# Patient Record
Sex: Female | Born: 1985 | Race: White | Hispanic: No | Marital: Married | Smoking: Never smoker
Health system: Southern US, Community
[De-identification: ages and names within clinical notes are randomized; demographics above are authoritative.]

## PROBLEM LIST (undated history)

## (undated) DIAGNOSIS — O26899 Other specified pregnancy related conditions, unspecified trimester: Secondary | ICD-10-CM

## (undated) DIAGNOSIS — R112 Nausea with vomiting, unspecified: Secondary | ICD-10-CM

## (undated) DIAGNOSIS — Z8619 Personal history of other infectious and parasitic diseases: Secondary | ICD-10-CM

## (undated) DIAGNOSIS — Z9889 Other specified postprocedural states: Secondary | ICD-10-CM

## (undated) DIAGNOSIS — T8859XA Other complications of anesthesia, initial encounter: Secondary | ICD-10-CM

## (undated) DIAGNOSIS — R12 Heartburn: Secondary | ICD-10-CM

## (undated) DIAGNOSIS — T4145XA Adverse effect of unspecified anesthetic, initial encounter: Secondary | ICD-10-CM

## (undated) DIAGNOSIS — R51 Headache: Secondary | ICD-10-CM

## (undated) HISTORY — PX: NO PAST SURGERIES: SHX2092

## (undated) HISTORY — DX: Personal history of other infectious and parasitic diseases: Z86.19

---
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
## 2005-03-24 ENCOUNTER — Emergency Department (HOSPITAL_COMMUNITY): Admission: EM | Admit: 2005-03-24 | Discharge: 2005-03-24 | Payer: Self-pay | Admitting: Emergency Medicine

## 2005-05-07 ENCOUNTER — Ambulatory Visit: Payer: Self-pay | Admitting: Psychology

## 2012-02-14 LAB — OB RESULTS CONSOLE GC/CHLAMYDIA
Chlamydia: NEGATIVE
Gonorrhea: NEGATIVE

## 2012-02-14 LAB — OB RESULTS CONSOLE ANTIBODY SCREEN: Antibody Screen: NEGATIVE

## 2012-02-14 LAB — OB RESULTS CONSOLE RUBELLA ANTIBODY, IGM: Rubella: IMMUNE

## 2012-09-11 ENCOUNTER — Other Ambulatory Visit (HOSPITAL_COMMUNITY): Payer: Self-pay | Admitting: Obstetrics and Gynecology

## 2012-09-11 DIAGNOSIS — O409XX0 Polyhydramnios, unspecified trimester, not applicable or unspecified: Secondary | ICD-10-CM

## 2012-09-11 DIAGNOSIS — O359XX0 Maternal care for (suspected) fetal abnormality and damage, unspecified, not applicable or unspecified: Secondary | ICD-10-CM

## 2012-09-15 ENCOUNTER — Telehealth (HOSPITAL_COMMUNITY): Payer: Self-pay | Admitting: *Deleted

## 2012-09-15 ENCOUNTER — Encounter (HOSPITAL_COMMUNITY): Payer: Self-pay | Admitting: *Deleted

## 2012-09-15 NOTE — Telephone Encounter (Signed)
Preadmission screen  

## 2012-09-16 ENCOUNTER — Ambulatory Visit (HOSPITAL_COMMUNITY)
Admission: RE | Admit: 2012-09-16 | Discharge: 2012-09-16 | Disposition: A | Discharge status: Home / Self Care | Payer: Managed Care, Other (non HMO) | Source: Ambulatory Visit | Attending: Obstetrics and Gynecology | Admitting: Obstetrics and Gynecology

## 2012-09-16 ENCOUNTER — Other Ambulatory Visit (HOSPITAL_COMMUNITY): Payer: Self-pay | Admitting: Obstetrics and Gynecology

## 2012-09-16 DIAGNOSIS — O409XX0 Polyhydramnios, unspecified trimester, not applicable or unspecified: Secondary | ICD-10-CM

## 2012-09-16 DIAGNOSIS — O4190X0 Disorder of amniotic fluid and membranes, unspecified, unspecified trimester, not applicable or unspecified: Secondary | ICD-10-CM

## 2012-09-16 DIAGNOSIS — O359XX0 Maternal care for (suspected) fetal abnormality and damage, unspecified, not applicable or unspecified: Secondary | ICD-10-CM

## 2012-09-16 DIAGNOSIS — Z3689 Encounter for other specified antenatal screening: Secondary | ICD-10-CM | POA: Insufficient documentation

## 2012-09-16 IMAGING — US US FETAL BPP W/O NONSTRESS
1 series · 13 of 28 positions shown · non-contrast
Comparison: none

[Series 1: us fetal bpp w/o nonstress · non-contrast · 39 acquisitions, 13 frames shown]
[im 2/39]
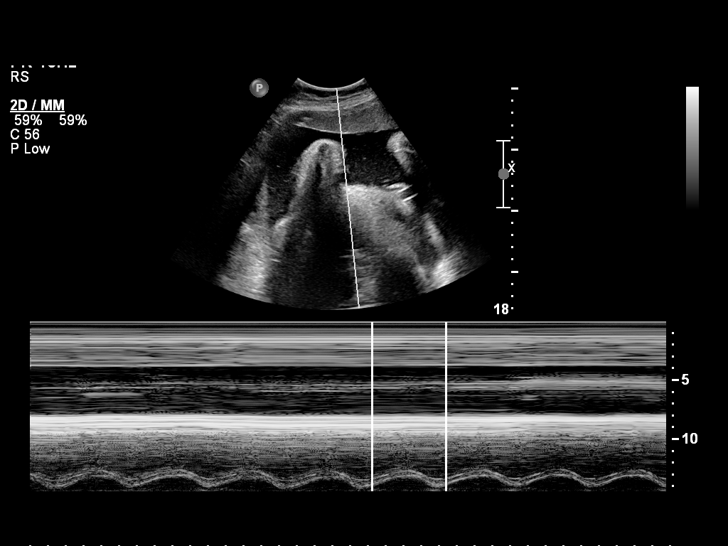
[im 5/39]
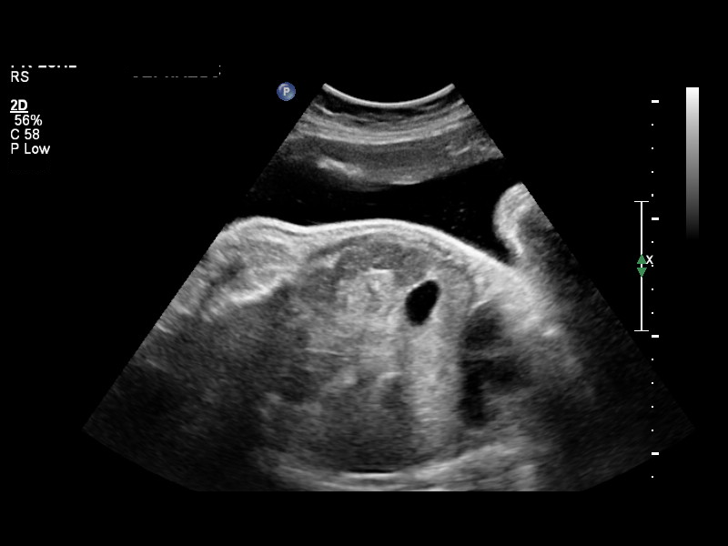
[im 8/39]
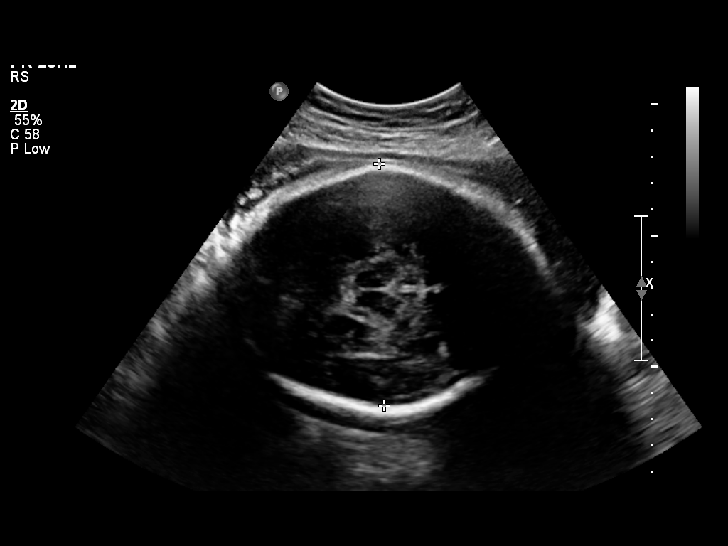
[im 10/39]
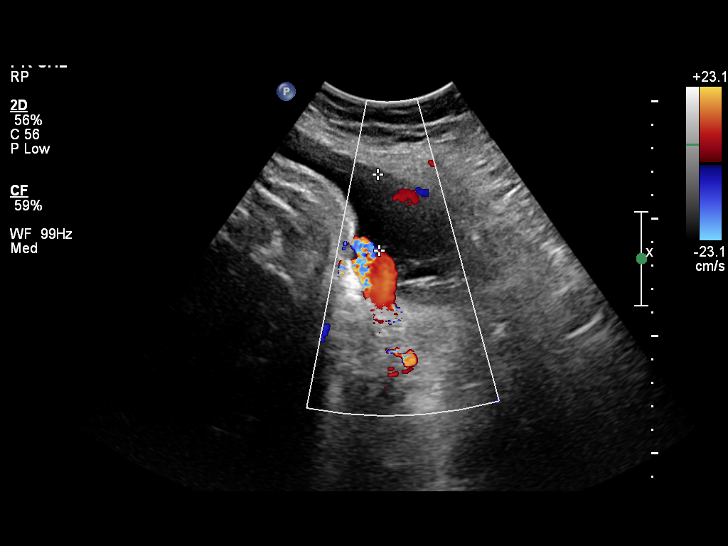
[im 13/39]
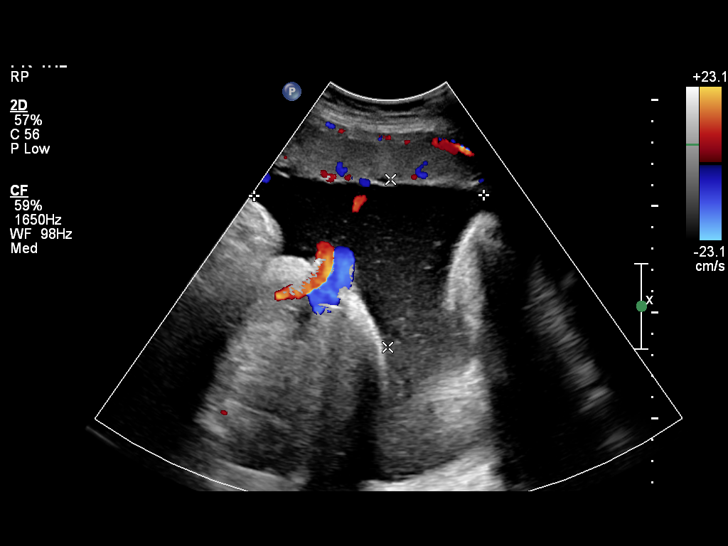
[im 16/39]
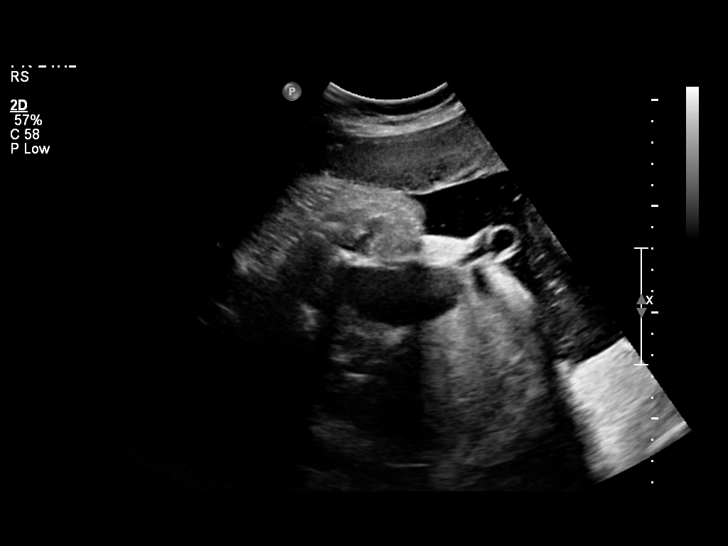
[im 20/39]
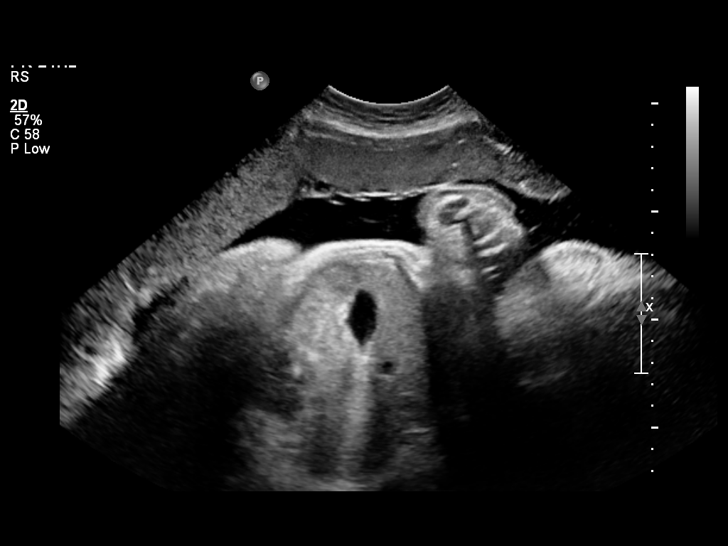
[im 23/39]
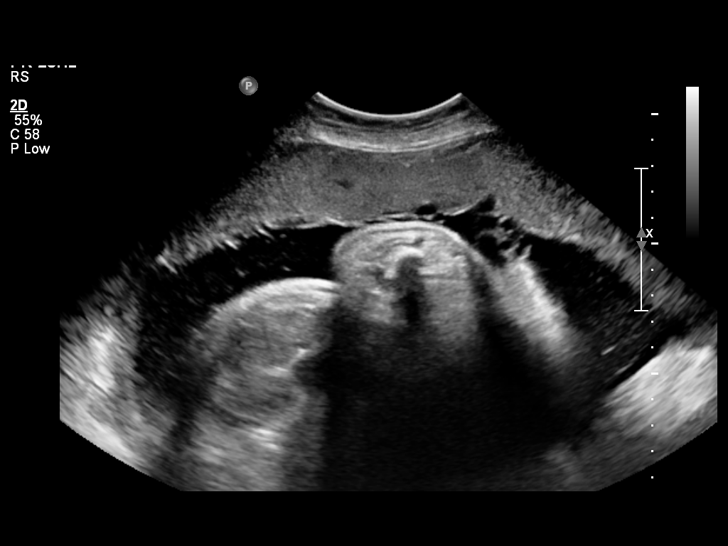
[im 26/39]
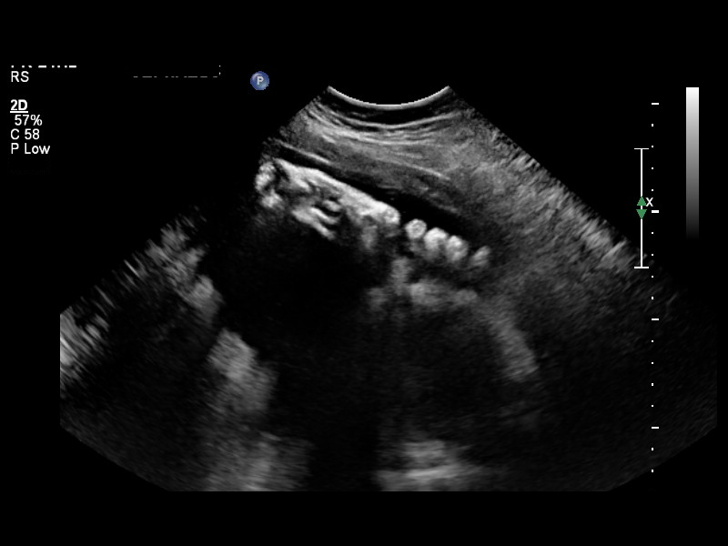
[im 29/39]
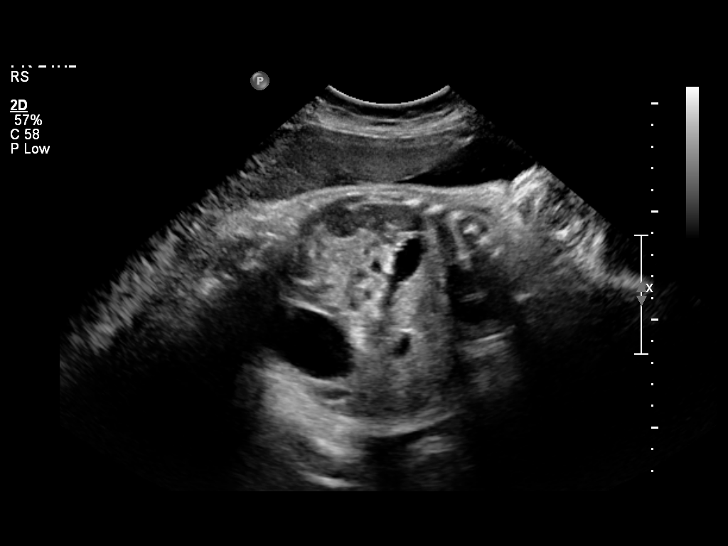
[im 31/39]
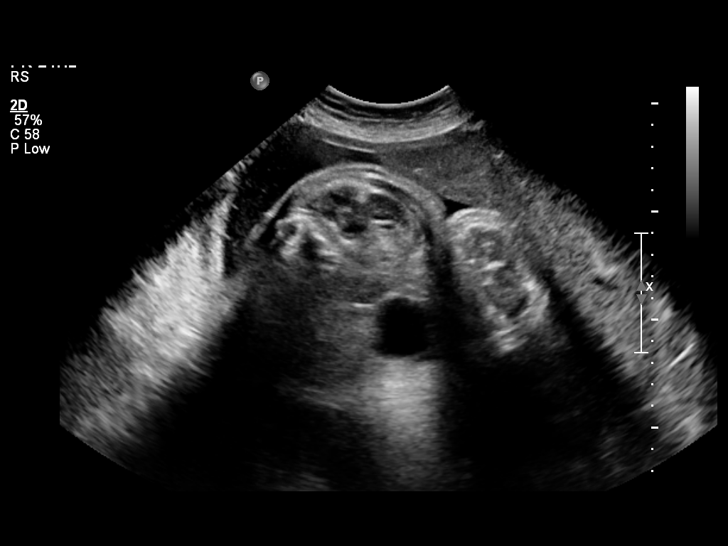
[im 34/39]
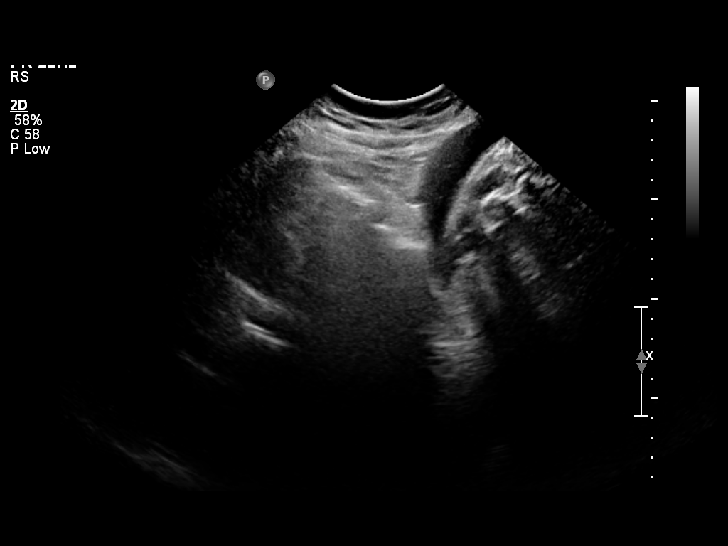
[im 37/39]
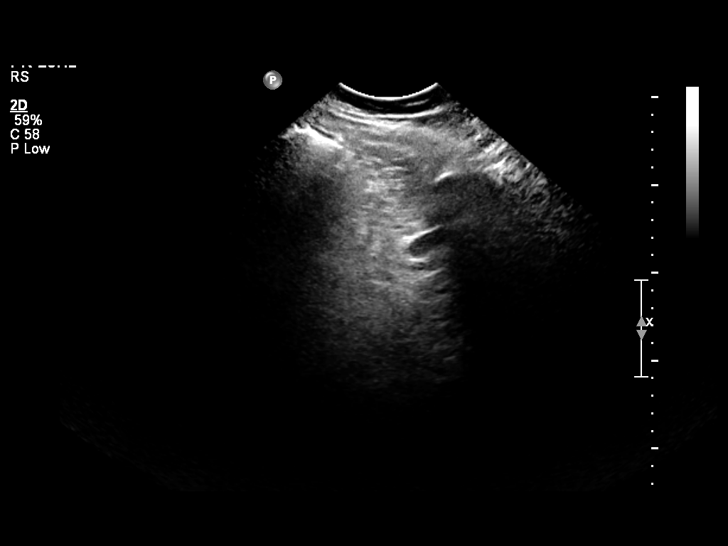

[13 of 28 positions shown; findings below may reference images not displayed]

OBSTETRICS REPORT
                      (Signed Final [DATE] [DATE])

Service(s) Provided

 [HOSPITAL]                                         [address]15.0
Indications

 Polyhydramnios
 Enlarged fetal bladder

Fetal Evaluation

 Num Of Fetuses:    1
 Fetal Heart Rate:  144                         bpm
 Cardiac Activity:  Observed
 Presentation:      Cephalic
 Placenta:          Anterior, above cervical os

 Amniotic Fluid
 AFI FV:      Subjectively increased
 AFI Sum:     23.63   cm      95   %Tile     Larg Pckt:     9.6  cm
 RUQ:   7.63   cm    RLQ:    3.23   cm    LUQ:   9.6     cm   LLQ:    3.17   cm
Biophysical Evaluation

 Amniotic F.V:   Pocket => 2 cm two         F. Tone:        Observed
                 planes
 F. Movement:    Observed                   Score:          [DATE]
 F. Breathing:   Observed
Biometry

 BPD:     91.7  mm    G. Age:   37w 2d
Gestational Age

 LMP:           38w 0d       Date:   [DATE]                 EDD:   [DATE]
 Clinical EDD:  38w 5d                                        EDD:   [DATE]
 U/S Today:     37w 2d                                        EDD:   [DATE]
 Best:          38w 5d    Det. By:   Clinical EDD             EDD:   [DATE]
Cervix Uterus Adnexa

 Cervix:       Not visualized (advanced GA >34 wks)
 Left Ovary:   Not visualized.
 Right Ovary:  Not visualized.
 Adnexa:     No abnormality visualized.
Impression

 Single living intrauterine fetus in Cephalic presentation.
 Amniotic fluid volume is subjectively increased, with AFI of
 23.63 cm (95 %ile).
 Biophysical profile score of [DATE].

 questions or concerns.

## 2012-09-24 ENCOUNTER — Ambulatory Visit (HOSPITAL_COMMUNITY)
Admission: RE | Admit: 2012-09-24 | Discharge: 2012-09-24 | Disposition: A | Discharge status: Home / Self Care | Payer: Managed Care, Other (non HMO) | Source: Ambulatory Visit | Attending: Obstetrics and Gynecology | Admitting: Obstetrics and Gynecology

## 2012-09-24 DIAGNOSIS — O409XX0 Polyhydramnios, unspecified trimester, not applicable or unspecified: Secondary | ICD-10-CM

## 2012-09-24 DIAGNOSIS — O4190X0 Disorder of amniotic fluid and membranes, unspecified, unspecified trimester, not applicable or unspecified: Secondary | ICD-10-CM

## 2012-09-24 DIAGNOSIS — Z3689 Encounter for other specified antenatal screening: Secondary | ICD-10-CM | POA: Insufficient documentation

## 2012-09-24 IMAGING — US US FETAL BPP W/O NONSTRESS
2 series · 13 of 21 positions shown · non-contrast
Comparison: none

[Series 1: us fetal bpp w/o nonstress · non-contrast · 1 of 1 slices shown (1 of 2)]
[im 1/1]
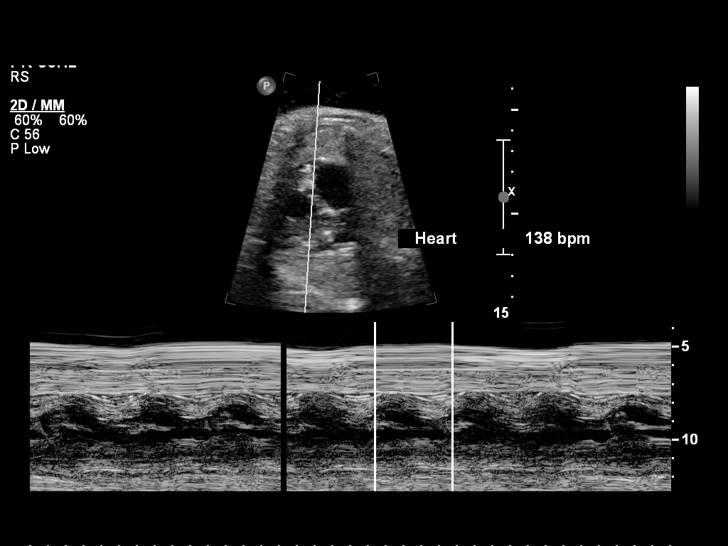

[Series 1: us fetal bpp w/o nonstress · non-contrast · 20 acquisitions, 12 frames shown (2 of 2)]
[im 2/20]
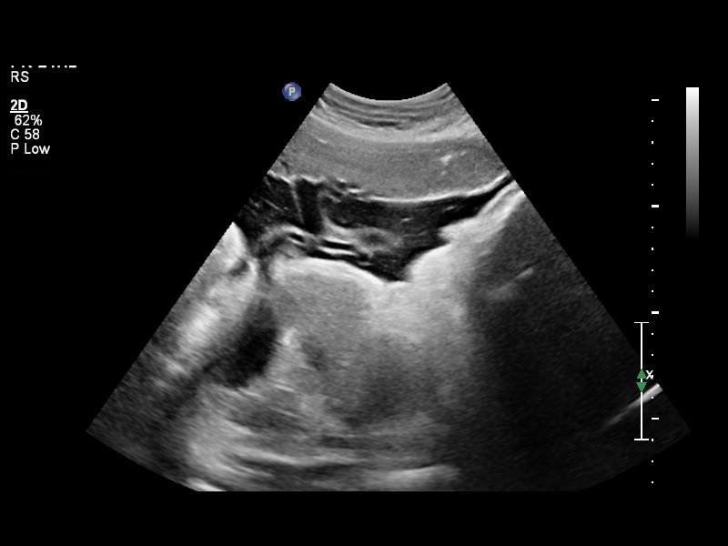
[im 4/20]
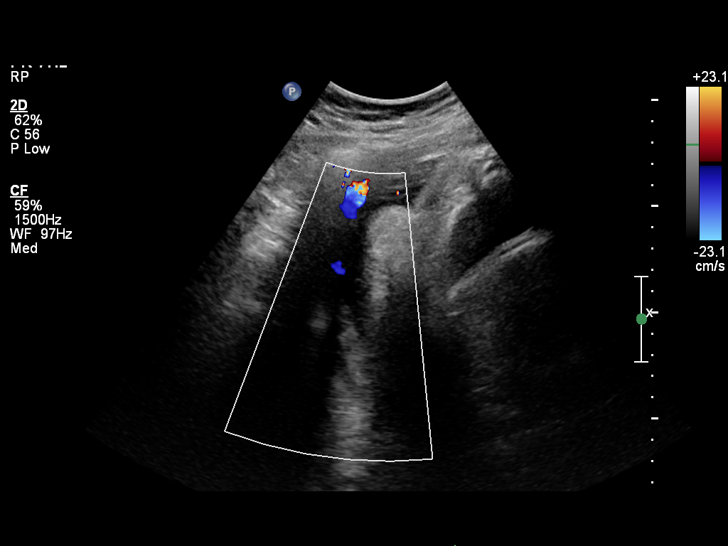
[im 5/20]
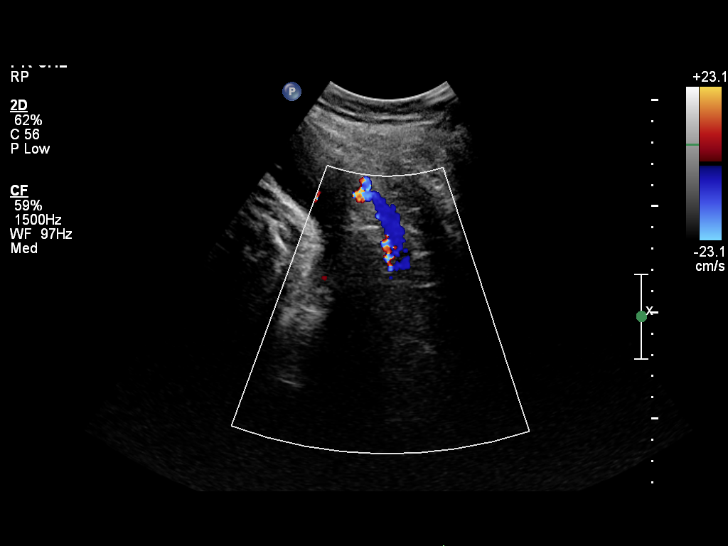
[im 7/20]
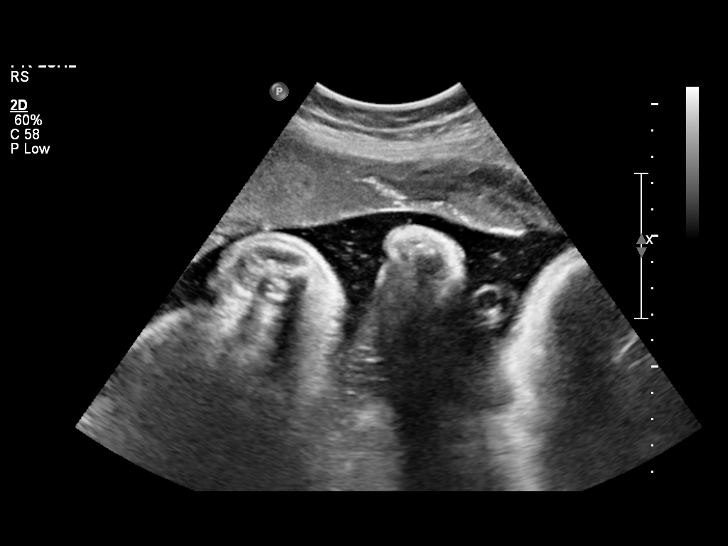
[im 8/20]
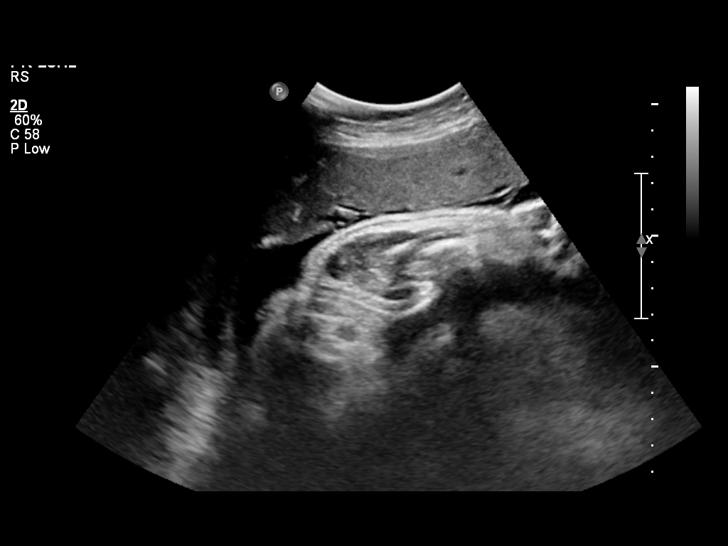
[im 10/20]
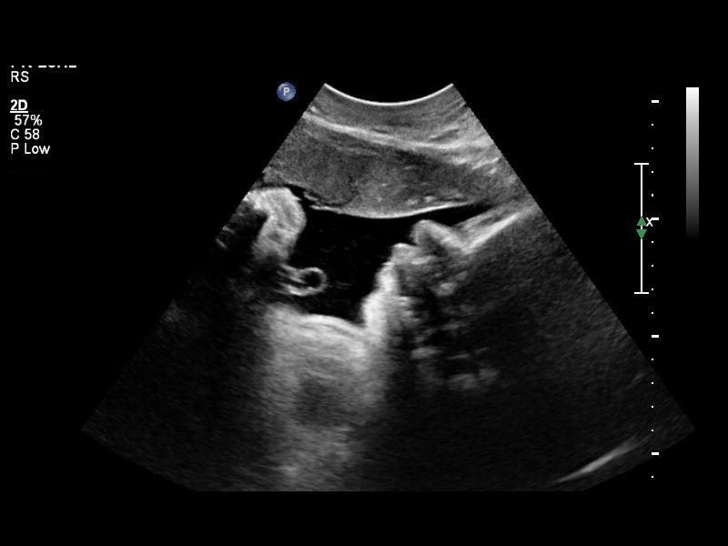
[im 12/20]
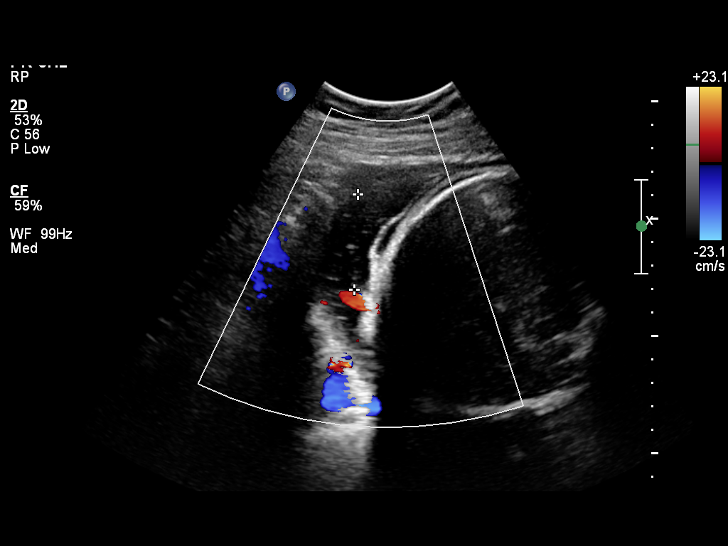
[im 13/20]
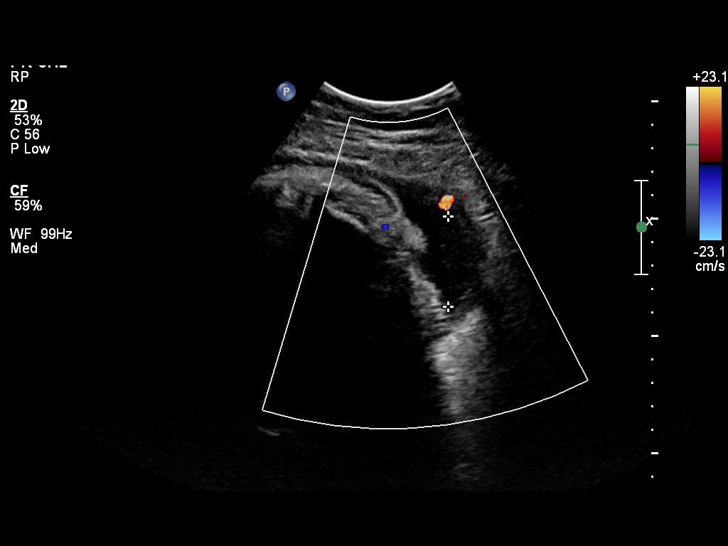
[im 15/20]
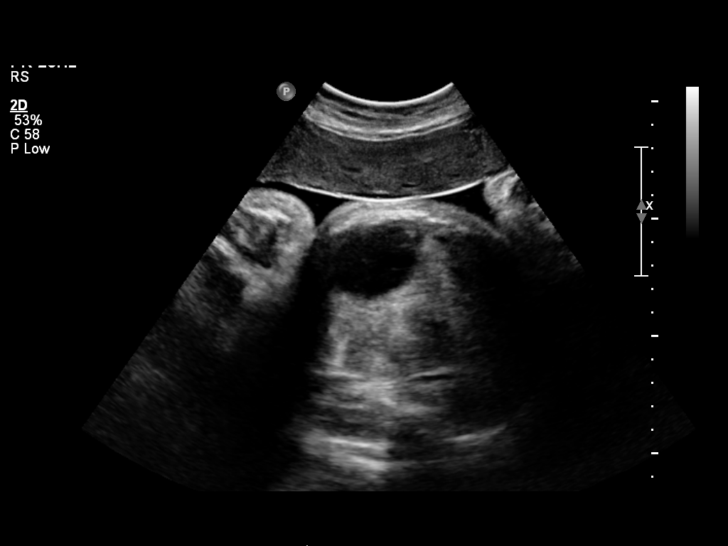
[im 16/20]
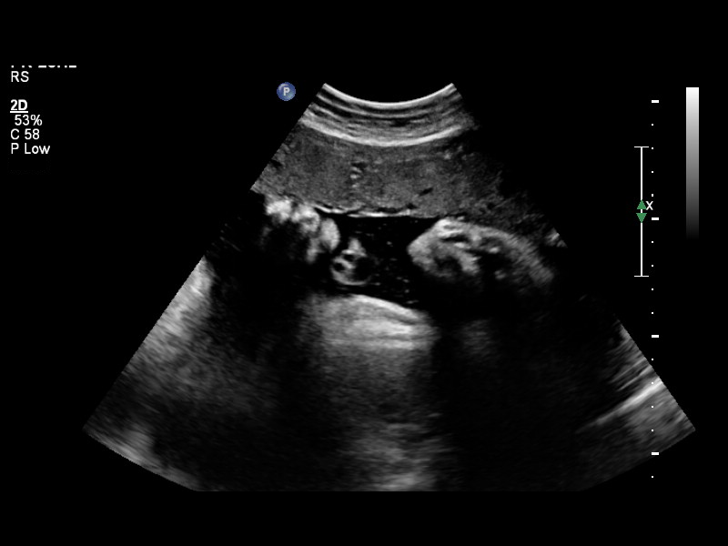
[im 18/20]
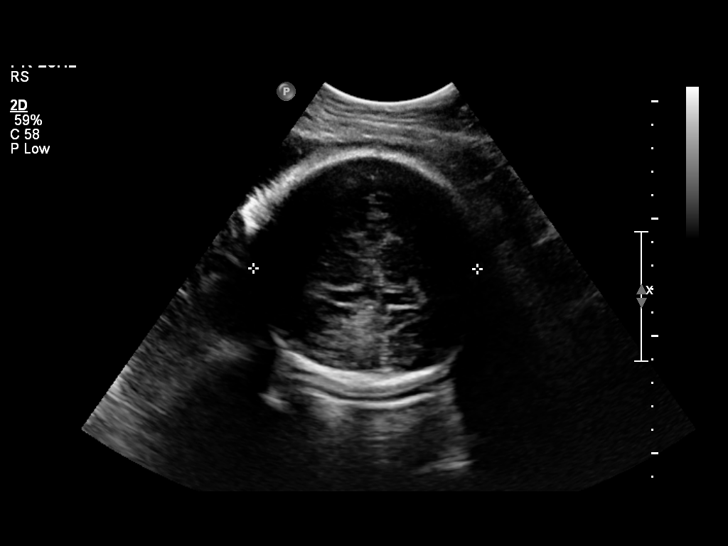
[im 20/20]
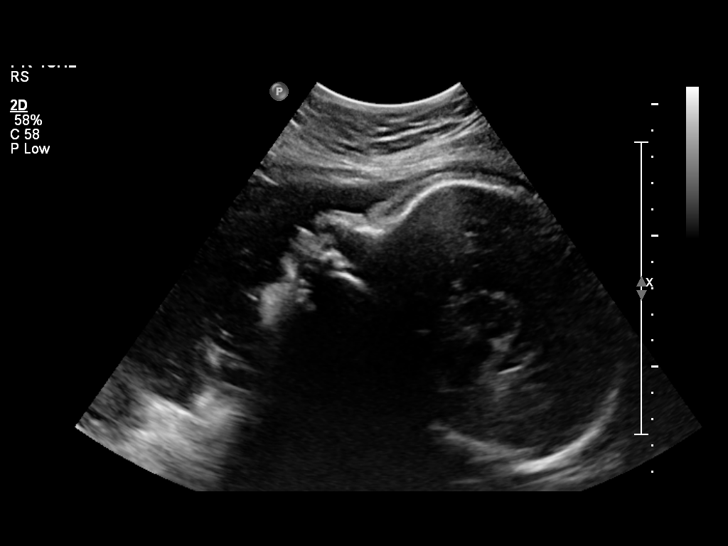

[13 of 21 positions shown; findings below may reference images not displayed]

OBSTETRICS REPORT
                      (Signed Final 09/24/2012 [DATE])

Service(s) Provided

 [HOSPITAL]                                         [address].0
Indications

 Polyhydramnios
Fetal Evaluation

 Num Of Fetuses:    1
 Fetal Heart Rate:  138                         bpm
 Cardiac Activity:  Observed
 Presentation:      Cephalic
 Placenta:          Anterior, above cervical os
 P. Cord            Previously Visualized
 Insertion:

 Comment:    BPP [DATE] in 20 minutes.

 Amniotic Fluid
 AFI FV:      Subjectively upper-normal
 AFI Sum:     20.95   cm      92   %Tile     Larg Pckt:   6.62   cm
 RUQ:   6.62   cm    RLQ:    4.05   cm    LUQ:   6.44    cm   LLQ:    3.84   cm
Biophysical Evaluation

 Amniotic F.V:   Pocket => 2 cm two         F. Tone:        Observed
                 planes
 F. Movement:    Observed                   Score:          [DATE]
 F. Breathing:   Observed
Biometry

 BPD:     95.2  mm    G. Age:   38w 6d
Gestational Age

 LMP:           39w 1d       Date:   12/25/11                 EDD:   09/30/12
 Clinical EDD:  39w 6d                                        EDD:   09/25/12
 U/S Today:     38w 6d                                        EDD:   10/02/12
 Best:          39w 6d    Det. By:   Clinical EDD             EDD:   09/25/12
Cervix Uterus Adnexa
 Cervix:       Not visualized (advanced GA >34 wks)
Impression

 Single live IUP in cephalic presentation.  BPP [DATE].
 Stable subjectively and quantitatively increased amniotic fluid
 volume.

 questions or concerns.

## 2012-09-25 ENCOUNTER — Inpatient Hospital Stay (HOSPITAL_COMMUNITY): Admission: AD | Admit: 2012-09-25 | Payer: Self-pay | Source: Ambulatory Visit | Admitting: Obstetrics and Gynecology

## 2012-09-25 ENCOUNTER — Inpatient Hospital Stay (HOSPITAL_COMMUNITY)
Admission: RE | Admit: 2012-09-25 | Discharge: 2012-09-28 | DRG: 766 | Disposition: A | Discharge status: Home / Self Care | Payer: Managed Care, Other (non HMO) | Source: Ambulatory Visit | Attending: Obstetrics and Gynecology | Admitting: Obstetrics and Gynecology

## 2012-09-25 ENCOUNTER — Encounter (HOSPITAL_COMMUNITY): Payer: Self-pay

## 2012-09-25 VITALS — BP 99/63 | HR 102 | Temp 98.3°F | Resp 20 | Ht 60.0 in | Wt 212.0 lb

## 2012-09-25 DIAGNOSIS — O34219 Maternal care for unspecified type scar from previous cesarean delivery: Secondary | ICD-10-CM

## 2012-09-25 DIAGNOSIS — O339 Maternal care for disproportion, unspecified: Secondary | ICD-10-CM | POA: Diagnosis present

## 2012-09-25 DIAGNOSIS — O330 Maternal care for disproportion due to deformity of maternal pelvic bones: Secondary | ICD-10-CM | POA: Diagnosis present

## 2012-09-25 DIAGNOSIS — O3660X0 Maternal care for excessive fetal growth, unspecified trimester, not applicable or unspecified: Secondary | ICD-10-CM | POA: Diagnosis present

## 2012-09-25 DIAGNOSIS — O409XX0 Polyhydramnios, unspecified trimester, not applicable or unspecified: Principal | ICD-10-CM | POA: Diagnosis present

## 2012-09-25 LAB — CBC
Hemoglobin: 11.5 g/dL — ABNORMAL LOW (ref 12.0–15.0)
MCH: 29.6 pg (ref 26.0–34.0)
MCHC: 33 g/dL (ref 30.0–36.0)
RDW: 14.1 % (ref 11.5–15.5)

## 2012-09-25 LAB — RPR: RPR Ser Ql: NONREACTIVE

## 2012-09-25 LAB — PREPARE RBC (CROSSMATCH)

## 2012-09-25 MED ORDER — EPHEDRINE 5 MG/ML INJ
10.0000 mg | INTRAVENOUS | Status: DC | PRN
  Filled 2012-09-25 (×2): qty 4

## 2012-09-25 MED ORDER — LIDOCAINE HCL (PF) 1 % IJ SOLN: 30.0000 mL | INTRAMUSCULAR | Status: DC | PRN

## 2012-09-25 MED ORDER — OXYTOCIN 40 UNITS IN LACTATED RINGERS INFUSION - SIMPLE MED
1.0000 m[IU]/min | INTRAVENOUS | Status: DC
  Administered 2012-09-25: 2 m[IU]/min via INTRAVENOUS

## 2012-09-25 MED ORDER — LACTATED RINGERS IV SOLN
500.0000 mL | INTRAVENOUS | Status: DC | PRN
  Administered 2012-09-26: 500 mL via INTRAVENOUS

## 2012-09-25 MED ORDER — TERBUTALINE SULFATE 1 MG/ML IJ SOLN: 0.2500 mg | Freq: Once | INTRAMUSCULAR | Status: AC | PRN

## 2012-09-25 MED ORDER — FLEET ENEMA 7-19 GM/118ML RE ENEM: 1.0000 | ENEMA | RECTAL | Status: DC | PRN

## 2012-09-25 MED ORDER — SODIUM BICARBONATE 8.4 % IV SOLN
INTRAVENOUS | Status: DC | PRN
  Administered 2012-09-25: 5 mL via EPIDURAL

## 2012-09-25 MED ORDER — OXYTOCIN BOLUS FROM INFUSION: 500.0000 mL | INTRAVENOUS | Status: DC

## 2012-09-25 MED ORDER — IBUPROFEN 600 MG PO TABS: 600.0000 mg | ORAL_TABLET | Freq: Four times a day (QID) | ORAL | Status: DC | PRN

## 2012-09-25 MED ORDER — LACTATED RINGERS IV SOLN: 500.0000 mL | Freq: Once | INTRAVENOUS | Status: DC

## 2012-09-25 MED ORDER — PHENYLEPHRINE 40 MCG/ML (10ML) SYRINGE FOR IV PUSH (FOR BLOOD PRESSURE SUPPORT): 80.0000 ug | PREFILLED_SYRINGE | INTRAVENOUS | Status: DC | PRN

## 2012-09-25 MED ORDER — NALBUPHINE SYRINGE 5 MG/0.5 ML: 5.0000 mg | INJECTION | INTRAMUSCULAR | Status: DC | PRN

## 2012-09-25 MED ORDER — EPHEDRINE 5 MG/ML INJ: 10.0000 mg | INTRAVENOUS | Status: DC | PRN

## 2012-09-25 MED ORDER — OXYTOCIN 40 UNITS IN LACTATED RINGERS INFUSION - SIMPLE MED
62.5000 mL/h | INTRAVENOUS | Status: DC
  Filled 2012-09-25: qty 1000

## 2012-09-25 MED ORDER — ONDANSETRON HCL 4 MG/2ML IJ SOLN
4.0000 mg | Freq: Four times a day (QID) | INTRAMUSCULAR | Status: DC | PRN
  Administered 2012-09-25 – 2012-09-26 (×3): 4 mg via INTRAVENOUS
  Filled 2012-09-25 (×3): qty 2

## 2012-09-25 MED ORDER — LACTATED RINGERS IV SOLN
INTRAVENOUS | Status: DC
  Administered 2012-09-25 – 2012-09-26 (×4): via INTRAVENOUS

## 2012-09-25 MED ORDER — PHENYLEPHRINE 40 MCG/ML (10ML) SYRINGE FOR IV PUSH (FOR BLOOD PRESSURE SUPPORT)
80.0000 ug | PREFILLED_SYRINGE | INTRAVENOUS | Status: DC | PRN
  Filled 2012-09-25: qty 5
  Filled 2012-09-25: qty 10

## 2012-09-25 MED ORDER — FENTANYL 2.5 MCG/ML BUPIVACAINE 1/10 % EPIDURAL INFUSION (WH - ANES)
14.0000 mL/h | INTRAMUSCULAR | Status: DC
  Administered 2012-09-25 – 2012-09-26 (×3): 14 mL/h via EPIDURAL
  Filled 2012-09-25 (×3): qty 125

## 2012-09-25 MED ORDER — CITRIC ACID-SODIUM CITRATE 334-500 MG/5ML PO SOLN
30.0000 mL | ORAL | Status: DC | PRN
  Administered 2012-09-26: 30 mL via ORAL
  Filled 2012-09-25: qty 15

## 2012-09-25 MED ORDER — DIPHENHYDRAMINE HCL 50 MG/ML IJ SOLN: 12.5000 mg | INTRAMUSCULAR | Status: DC | PRN

## 2012-09-25 MED ORDER — ZOLPIDEM TARTRATE 5 MG PO TABS: 5.0000 mg | ORAL_TABLET | Freq: Every evening | ORAL | Status: DC | PRN

## 2012-09-25 MED ORDER — ACETAMINOPHEN 325 MG PO TABS: 650.0000 mg | ORAL_TABLET | ORAL | Status: DC | PRN

## 2012-09-25 MED ORDER — OXYCODONE-ACETAMINOPHEN 5-325 MG PO TABS: 1.0000 | ORAL_TABLET | ORAL | Status: DC | PRN

## 2012-09-25 NOTE — Anesthesia Procedure Notes (Signed)

## 2012-09-25 NOTE — Anesthesia Preprocedure Evaluation (Signed)

## 2012-09-25 NOTE — Progress Notes (Signed)
Pt comfortable.  FHTs 130s gstv, NST R Toco q3  SVE per nurse loose 2/80/-2

## 2012-09-25 NOTE — H&P (Signed)
26 y.o. [redacted]w[redacted]d  G1P0 comes in for induction at term for polyhydramnios.  Otherwise has good fetal movement and no bleeding.  Past Medical History  Diagnosis Date  . H/O varicella   . Migraines     Past Surgical History  Procedure Date  . No past surgeries     OB History    Grav Para Term Preterm Abortions TAB SAB Ect Mult Living   1              # Outc Date GA Lbr Len/2nd Wgt Sex Del Anes PTL Lv   1 CUR               History   Social History  . Marital Status: Married    Spouse Name: N/A    Number of Children: N/A  . Years of Education: N/A   Occupational History  . Not on file.   Social History Main Topics  . Smoking status: Never Smoker   . Smokeless tobacco: Never Used  . Alcohol Use: No  . Drug Use: No  . Sexually Active: Yes   Other Topics Concern  . Not on file   Social History Narrative  . No narrative on file   [place]    Prenatal Transfer Tool  Maternal Diabetes: No Genetic Screening: Declined Maternal Ultrasounds/Referrals: Abnormal:  Findings:   Other:polyhydramnios; fetal bladder at one point was 5 cm on two separate exams; last two u/s at [facility] their did not appear to be an enlarged bladder. Fetal Ultrasounds or other Referrals:  None Maternal Substance Abuse:  No Significant Maternal Medications:  None Significant Maternal Lab Results: None  Other JYN:WGNF have been reassuring.  Last AFI was 21.  Pt was measuring 7#10 at 37 weeks.      Lungs/Cor:  NAD Abdomen:  soft, gravid Ex:  no cords, erythema SVE:  1/2/-2 FHTs:  120s, good STV, NST R Toco:  qocc   A/P   Term with polyhydramnios for induction.    GBS neg.  Ellionna Buckbee A

## 2012-09-26 ENCOUNTER — Encounter (HOSPITAL_COMMUNITY): Payer: Self-pay | Admitting: Anesthesiology

## 2012-09-26 ENCOUNTER — Inpatient Hospital Stay (HOSPITAL_COMMUNITY): Payer: Managed Care, Other (non HMO) | Admitting: Anesthesiology

## 2012-09-26 ENCOUNTER — Encounter (HOSPITAL_COMMUNITY)
Admission: RE | Disposition: A | Discharge status: Home / Self Care | Payer: Self-pay | Source: Ambulatory Visit | Attending: Obstetrics and Gynecology

## 2012-09-26 ENCOUNTER — Encounter (HOSPITAL_COMMUNITY): Payer: Self-pay

## 2012-09-26 SURGERY — Surgical Case
Anesthesia: Epidural | Site: Abdomen | Wound class: Clean Contaminated

## 2012-09-26 MED ORDER — TETANUS-DIPHTH-ACELL PERTUSSIS 5-2.5-18.5 LF-MCG/0.5 IM SUSP: 0.5000 mL | Freq: Once | INTRAMUSCULAR | Status: DC

## 2012-09-26 MED ORDER — METHYLERGONOVINE MALEATE 0.2 MG/ML IJ SOLN: 0.2000 mg | INTRAMUSCULAR | Status: DC | PRN

## 2012-09-26 MED ORDER — ONDANSETRON HCL 4 MG/2ML IJ SOLN
INTRAMUSCULAR | Status: AC
  Filled 2012-09-26: qty 2

## 2012-09-26 MED ORDER — NALBUPHINE HCL 10 MG/ML IJ SOLN: 5.0000 mg | INTRAMUSCULAR | Status: DC | PRN

## 2012-09-26 MED ORDER — NALOXONE HCL 0.4 MG/ML IJ SOLN: 0.4000 mg | INTRAMUSCULAR | Status: DC | PRN

## 2012-09-26 MED ORDER — KETOROLAC TROMETHAMINE 30 MG/ML IJ SOLN: 30.0000 mg | Freq: Four times a day (QID) | INTRAMUSCULAR | Status: AC | PRN

## 2012-09-26 MED ORDER — MORPHINE SULFATE 0.5 MG/ML IJ SOLN
INTRAMUSCULAR | Status: AC
  Filled 2012-09-26: qty 10

## 2012-09-26 MED ORDER — OXYTOCIN 40 UNITS IN LACTATED RINGERS INFUSION - SIMPLE MED: 62.5000 mL/h | INTRAVENOUS | Status: DC

## 2012-09-26 MED ORDER — MORPHINE SULFATE (PF) 0.5 MG/ML IJ SOLN
INTRAMUSCULAR | Status: DC | PRN
  Administered 2012-09-26: 3 mg via EPIDURAL

## 2012-09-26 MED ORDER — ZOLPIDEM TARTRATE 5 MG PO TABS: 5.0000 mg | ORAL_TABLET | Freq: Every evening | ORAL | Status: DC | PRN

## 2012-09-26 MED ORDER — DIPHENHYDRAMINE HCL 50 MG/ML IJ SOLN: 25.0000 mg | INTRAMUSCULAR | Status: DC | PRN

## 2012-09-26 MED ORDER — SIMETHICONE 80 MG PO CHEW: 80.0000 mg | CHEWABLE_TABLET | ORAL | Status: DC | PRN

## 2012-09-26 MED ORDER — MEPERIDINE HCL 25 MG/ML IJ SOLN: 6.2500 mg | INTRAMUSCULAR | Status: DC | PRN

## 2012-09-26 MED ORDER — DIBUCAINE 1 % RE OINT: 1.0000 "application " | TOPICAL_OINTMENT | RECTAL | Status: DC | PRN

## 2012-09-26 MED ORDER — DIPHENHYDRAMINE HCL 50 MG/ML IJ SOLN: 12.5000 mg | INTRAMUSCULAR | Status: DC | PRN

## 2012-09-26 MED ORDER — IBUPROFEN 600 MG PO TABS
600.0000 mg | ORAL_TABLET | Freq: Four times a day (QID) | ORAL | Status: DC
  Administered 2012-09-27 – 2012-09-28 (×6): 600 mg via ORAL
  Filled 2012-09-26 (×5): qty 1

## 2012-09-26 MED ORDER — IBUPROFEN 600 MG PO TABS: 600.0000 mg | ORAL_TABLET | Freq: Four times a day (QID) | ORAL | Status: DC | PRN

## 2012-09-26 MED ORDER — SODIUM BICARBONATE 8.4 % IV SOLN
INTRAVENOUS | Status: AC
  Filled 2012-09-26: qty 50

## 2012-09-26 MED ORDER — METHYLERGONOVINE MALEATE 0.2 MG PO TABS: 0.2000 mg | ORAL_TABLET | ORAL | Status: DC | PRN

## 2012-09-26 MED ORDER — DIPHENHYDRAMINE HCL 25 MG PO CAPS: 25.0000 mg | ORAL_CAPSULE | Freq: Four times a day (QID) | ORAL | Status: DC | PRN

## 2012-09-26 MED ORDER — OXYTOCIN 10 UNIT/ML IJ SOLN
INTRAMUSCULAR | Status: AC
  Filled 2012-09-26: qty 4

## 2012-09-26 MED ORDER — DEXAMETHASONE SODIUM PHOSPHATE 10 MG/ML IJ SOLN
INTRAMUSCULAR | Status: AC
  Filled 2012-09-26: qty 1

## 2012-09-26 MED ORDER — OXYCODONE-ACETAMINOPHEN 5-325 MG PO TABS
1.0000 | ORAL_TABLET | ORAL | Status: DC | PRN
  Administered 2012-09-27 – 2012-09-28 (×2): 1 via ORAL
  Filled 2012-09-26 (×3): qty 1

## 2012-09-26 MED ORDER — SCOPOLAMINE 1 MG/3DAYS TD PT72: 1.0000 | MEDICATED_PATCH | Freq: Once | TRANSDERMAL | Status: DC

## 2012-09-26 MED ORDER — LIDOCAINE-EPINEPHRINE (PF) 2 %-1:200000 IJ SOLN
INTRAMUSCULAR | Status: AC
  Filled 2012-09-26: qty 20

## 2012-09-26 MED ORDER — SENNOSIDES-DOCUSATE SODIUM 8.6-50 MG PO TABS
2.0000 | ORAL_TABLET | Freq: Every day | ORAL | Status: DC
  Administered 2012-09-26 – 2012-09-27 (×2): 2 via ORAL

## 2012-09-26 MED ORDER — SCOPOLAMINE 1 MG/3DAYS TD PT72
MEDICATED_PATCH | TRANSDERMAL | Status: AC
  Administered 2012-09-26: 1.5 mg via TOPICAL
  Filled 2012-09-26: qty 1

## 2012-09-26 MED ORDER — METOCLOPRAMIDE HCL 5 MG/ML IJ SOLN: 10.0000 mg | Freq: Three times a day (TID) | INTRAMUSCULAR | Status: DC | PRN

## 2012-09-26 MED ORDER — LACTATED RINGERS IV SOLN
INTRAVENOUS | Status: DC
  Administered 2012-09-27: 01:00:00 via INTRAVENOUS

## 2012-09-26 MED ORDER — OXYTOCIN 10 UNIT/ML IJ SOLN
40.0000 [IU] | INTRAVENOUS | Status: DC | PRN
  Administered 2012-09-26: 40 [IU] via INTRAVENOUS

## 2012-09-26 MED ORDER — DEXTROSE 5 % IV SOLN: 1.0000 ug/kg/h | INTRAVENOUS | Status: DC | PRN

## 2012-09-26 MED ORDER — SODIUM CHLORIDE 0.9 % IJ SOLN: 3.0000 mL | INTRAMUSCULAR | Status: DC | PRN

## 2012-09-26 MED ORDER — KETOROLAC TROMETHAMINE 60 MG/2ML IM SOLN
INTRAMUSCULAR | Status: AC
  Administered 2012-09-26: 60 mg via INTRAMUSCULAR
  Filled 2012-09-26: qty 2

## 2012-09-26 MED ORDER — ONDANSETRON HCL 4 MG/2ML IJ SOLN: 4.0000 mg | Freq: Three times a day (TID) | INTRAMUSCULAR | Status: DC | PRN

## 2012-09-26 MED ORDER — KETOROLAC TROMETHAMINE 60 MG/2ML IM SOLN
60.0000 mg | Freq: Once | INTRAMUSCULAR | Status: AC | PRN
  Filled 2012-09-26: qty 2

## 2012-09-26 MED ORDER — DEXAMETHASONE SODIUM PHOSPHATE 10 MG/ML IJ SOLN
INTRAMUSCULAR | Status: DC | PRN
  Administered 2012-09-26: 10 mg via INTRAVENOUS

## 2012-09-26 MED ORDER — MENTHOL 3 MG MT LOZG: 1.0000 | LOZENGE | OROMUCOSAL | Status: DC | PRN

## 2012-09-26 MED ORDER — FENTANYL CITRATE 0.05 MG/ML IJ SOLN: 25.0000 ug | INTRAMUSCULAR | Status: DC | PRN

## 2012-09-26 MED ORDER — SIMETHICONE 80 MG PO CHEW
80.0000 mg | CHEWABLE_TABLET | Freq: Three times a day (TID) | ORAL | Status: DC
  Administered 2012-09-26 – 2012-09-28 (×6): 80 mg via ORAL

## 2012-09-26 MED ORDER — GENTAMICIN SULFATE 40 MG/ML IJ SOLN
INTRAVENOUS | Status: DC
  Administered 2012-09-26: 100 mL via INTRAVENOUS
  Filled 2012-09-26: qty 8.25

## 2012-09-26 MED ORDER — PRENATAL MULTIVITAMIN CH
1.0000 | ORAL_TABLET | Freq: Every day | ORAL | Status: DC
  Administered 2012-09-28: 1 via ORAL
  Filled 2012-09-26: qty 1

## 2012-09-26 MED ORDER — ONDANSETRON HCL 4 MG PO TABS: 4.0000 mg | ORAL_TABLET | ORAL | Status: DC | PRN

## 2012-09-26 MED ORDER — LANOLIN HYDROUS EX OINT: 1.0000 "application " | TOPICAL_OINTMENT | CUTANEOUS | Status: DC | PRN

## 2012-09-26 MED ORDER — ONDANSETRON HCL 4 MG/2ML IJ SOLN
4.0000 mg | INTRAMUSCULAR | Status: DC | PRN
  Administered 2012-09-26: 4 mg via INTRAVENOUS
  Filled 2012-09-26: qty 2

## 2012-09-26 MED ORDER — ONDANSETRON HCL 4 MG/2ML IJ SOLN
INTRAMUSCULAR | Status: DC | PRN
  Administered 2012-09-26: 4 mg via INTRAVENOUS

## 2012-09-26 MED ORDER — WITCH HAZEL-GLYCERIN EX PADS: 1.0000 "application " | MEDICATED_PAD | CUTANEOUS | Status: DC | PRN

## 2012-09-26 MED ORDER — PROMETHAZINE HCL 25 MG/ML IJ SOLN: 12.5000 mg | Freq: Four times a day (QID) | INTRAMUSCULAR | Status: DC | PRN

## 2012-09-26 MED ORDER — DIPHENHYDRAMINE HCL 25 MG PO CAPS: 25.0000 mg | ORAL_CAPSULE | ORAL | Status: DC | PRN

## 2012-09-26 MED ORDER — LACTATED RINGERS IV SOLN
INTRAVENOUS | Status: DC | PRN
  Administered 2012-09-26 (×4): via INTRAVENOUS

## 2012-09-26 SURGICAL SUPPLY — 30 items
CLOTH BEACON ORANGE TIMEOUT ST (SAFETY) ×2 IMPLANT
DERMABOND ADVANCED (GAUZE/BANDAGES/DRESSINGS) ×1
DERMABOND ADVANCED .7 DNX12 (GAUZE/BANDAGES/DRESSINGS) ×1 IMPLANT
DRESSING TELFA 8X3 (GAUZE/BANDAGES/DRESSINGS) ×2 IMPLANT
DRSG OPSITE POSTOP 4X10 (GAUZE/BANDAGES/DRESSINGS) IMPLANT
DURAPREP 26ML APPLICATOR (WOUND CARE) ×2 IMPLANT
ELECT REM PT RETURN 9FT ADLT (ELECTROSURGICAL) ×2
ELECTRODE REM PT RTRN 9FT ADLT (ELECTROSURGICAL) ×1 IMPLANT
EXTRACTOR VACUUM M CUP 4 TUBE (SUCTIONS) IMPLANT
GAUZE SPONGE 4X4 12PLY STRL LF (GAUZE/BANDAGES/DRESSINGS) ×4 IMPLANT
GLOVE BIO SURGEON STRL SZ7 (GLOVE) ×4 IMPLANT
GOWN PREVENTION PLUS LG XLONG (DISPOSABLE) ×4 IMPLANT
KIT ABG SYR 3ML LUER SLIP (SYRINGE) IMPLANT
NEEDLE HYPO 25X5/8 SAFETYGLIDE (NEEDLE) IMPLANT
NS IRRIG 1000ML POUR BTL (IV SOLUTION) ×2 IMPLANT
PACK C SECTION WH (CUSTOM PROCEDURE TRAY) ×2 IMPLANT
PAD ABD 7.5X8 STRL (GAUZE/BANDAGES/DRESSINGS) ×2 IMPLANT
PAD OB MATERNITY 4.3X12.25 (PERSONAL CARE ITEMS) IMPLANT
RTRCTR C-SECT PINK 25CM LRG (MISCELLANEOUS) ×2 IMPLANT
SLEEVE SCD COMPRESS KNEE MED (MISCELLANEOUS) IMPLANT
SPONGE LAP 18X18 X RAY DECT (DISPOSABLE) ×4 IMPLANT
STAPLER VISISTAT 35W (STAPLE) IMPLANT
SUT CHROMIC 1 CTX 36 (SUTURE) ×8 IMPLANT
SUT PDS AB 0 CTX 60 (SUTURE) ×2 IMPLANT
SUT VIC AB 2-0 CT1 27 (SUTURE) ×1
SUT VIC AB 2-0 CT1 TAPERPNT 27 (SUTURE) ×1 IMPLANT
SUT VIC AB 4-0 KS 27 (SUTURE) IMPLANT
TOWEL OR 17X24 6PK STRL BLUE (TOWEL DISPOSABLE) ×4 IMPLANT
TRAY FOLEY CATH 14FR (SET/KITS/TRAYS/PACK) ×2 IMPLANT
WATER STERILE IRR 1000ML POUR (IV SOLUTION) ×2 IMPLANT

## 2012-09-26 NOTE — Transfer of Care (Signed)
Immediate Anesthesia Transfer of Care Note  Patient: Savannah Chavez  Procedure(s) Performed: Procedure(s) (LRB) with comments: CESAREAN SECTION (N/A)  Patient Location: PACU  Anesthesia Type:Epidural  Level of Consciousness: awake, alert  and oriented  Airway & Oxygen Therapy: Patient Spontanous Breathing  Post-op Assessment: Report given to PACU RN and Post -op Vital signs reviewed and stable  Post vital signs: stable  Complications: No apparent anesthesia complications

## 2012-09-26 NOTE — Progress Notes (Signed)
Spoke with Dr Henderson Cloud over phone. Informed of patient's Uterine Activity and Cervical exam. Decision made to decrease pitocin to half the current dose and then continue to increase per order.

## 2012-09-26 NOTE — Progress Notes (Signed)
Patient ID: Savannah Chavez, female   DOB: 1986-03-15, 26 y.o.   MRN: [id number]  S: C/O back pain & vomitting O: AFVSS cvx 5/80/-1/0, caput & molding toco Q2-3  IUPC replaced  A/P 1) Minimal change 2) Phenergan for nausea  3) Attempt anesthesia redose for pain control

## 2012-09-26 NOTE — Op Note (Signed)
Pre-Operative Diagnosis: 1) 40+1 Week Intrauterine Pregnancy 2) Failure to Progress 3) Suspected Fetal Macrosomia 4) Cephalopelvic Disproportion Postoperative Diagnosis: Same Procedure: Primary Low Transverse Cesarean Section Surgeon: Dr. Waynard Reeds Assistant: None Operative Findings: Vigorous female infant in the vertex OA presentation with apgars of 8 & 9.  Normal ovaries, tubes, uterus, and appendix.  Weight pending Specimen: Placenta for disposal EBL: Total I/O In: 3600 [I.V.:3600] Out: 1325 [Urine:450; Emesis/NG output:75; Blood:800]   Procedure:[name] is an 26 year old gravida 1 para 0 at 40 weeks and 1 days estimated gestational age who presents for cesarean section. The patient was admitted for induction due to polyhydramnios.  Pt progressed slowly in labor.  Pitocin was increased up to 40 mun but adequate labor could not be acheived. The patients cervix started to develop edema and the patient never progressed past 4-5 cm.  An ultrasound showed estimated fetal weight to be 7#11 3 weeks ago.  Given the concern for macrosomia and CPD the decision was made to proceed with cesarean section. Following the appropriate informed consent the patient was brought to the operating room where epidural anesthesia was confirmed to be adequate. She was placed in the dorsal supine position with a leftward tilt. She was prepped and draped in the normal sterile fashion. Scalpel was then used to make a Pfannenstiel skin incision which was carried down to the underlying layers of soft tissue to the fascia. The fascia was incised in the midline and the fascial incision was extended laterally with Mayo scissors. The superior aspect of the fascial incision was grasped with Coker clamps x2, tented up and the rectus muscles dissected off sharply with the electrocautery unit area and the same procedure was repeated on the inferior aspect of the fascial incision. The rectus muscles were separated in the midline. The  abdominal peritoneum was identified, tented up, entered sharply, and the incision was extended superiorly and inferiorly with good visualization of the bladder. The Alexis retractor was then deployed. The small bowel and appendix entered the incision and were visually normal.  These were packed into the upper abdomen with a moist laparotomy sponge. The vesicouterine peritoneum was identified, tented up, entered sharply, and the bladder flap was created digitally. Scalpel was then used to make a low transverse incision on the uterus which was extended laterally with both blunt dissection and the bandage scissors. The fetal vertex was identified, delivered easily through the uterine incision followed by the body. The infant was bulb suctioned on the operative field cried vigorously, cord was clamped and cut and the infant was passed to the waiting neonatologist. Placenta was then delivered spontaneously, the uterus was cleared of all clot and debris. The uterine incision was repaired with #1 chromic in running locked fashion followed by a second imbricating layer. Ovaries and tubes were inspected and normal. The laparotomy sponge was removed. The Alexis retractor was removed. The uterus was returned to the abdominal cavity the abdominal cavity was cleared of all clot and debris. The abdominal peritoneum was reapproximated with 2-0 Vicryl in a running fashion, the rectus muscles was reapproximated with #1 chromic in a running fashion. The fascia was closed with a looped PDS in a running fashion. The skin was closed with 4-0 vicryl in a subcuticular fashion and Dermabond. All sponge lap and needle counts were correct x2. Patient tolerated the procedure well and recovered in stable condition following the procedure.

## 2012-09-26 NOTE — Progress Notes (Signed)
Patient ID: Savannah Chavez, female   DOB: 1985/11/20, 26 y.o.   MRN: [id number]  S:Still with back pain but unrelated to contractions. O: AFVSS FHT 150 minimal variability, faint variables with ctxns cvx 4-5/70-70/-1 no with developing edema toco q2-3  A/P 1) MVUs still inadequate but patient on 34 of pit and just unable to achieve adequate labor.  NOw with resultant edema of the cervix and caput and molding remote from delivery will proceed with cesarean.  R/B/A discussed with pt & husband.  Will proceed with cesarean.

## 2012-09-26 NOTE — Anesthesia Postprocedure Evaluation (Signed)
Anesthesia Post Note  Patient: Savannah Chavez  Procedure(s) Performed: Procedure(s) (LRB): CESAREAN SECTION (N/A)  Anesthesia type: Epidural  Patient location: PACU  Post pain: Pain level controlled  Post assessment: Post-op Vital signs reviewed  Last Vitals:  Filed Vitals:   09/26/12 1625  BP:   Pulse: 125  Temp:   Resp:     Post vital signs: stable  Level of consciousness: awake  Complications: No apparent anesthesia complications

## 2012-09-26 NOTE — Progress Notes (Signed)
Patient ID: Savannah Chavez, female   DOB: [birth year], 27 y.o.   MRN: [id number]   S: Comfortable with epidural O: Filed Vitals:   09/26/12 0731 09/26/12 0801 09/26/12 0831 09/26/12 0901  BP: 108/67 109/72 113/62 117/73  Pulse: 108 115 117 116  Temp:  98.3 F (36.8 C)    TempSrc:  Oral    Resp:  20    Height:      Weight:      SpO2:       AOx3 Obese gravid soft FHT 150 minimal variability, ocassional small accels, occ variables with contractions cvx 4-5/70/-2 toco Q2-3  A/P 1) Continue pitocin: throughout the last 24 hours achieving adequate labor has been difficult.  Pitocin has been as high as 40. Pt has had adequate MVUs on 1-2 occassions.  Difficulty in achieving adequate labor may be due to overdistended uterus from suspected macrosomia.  Pt is aware that given her size and the fetal size she is at high risk for cesarean.

## 2012-09-27 LAB — CBC
MCHC: 33.1 g/dL (ref 30.0–36.0)
RDW: 14.4 % (ref 11.5–15.5)
WBC: 19.7 10*3/uL — ABNORMAL HIGH (ref 4.0–10.5)

## 2012-09-27 NOTE — Progress Notes (Signed)
Subjective: Postpartum Day 1: Cesarean Delivery Patient reports good pain control. Denies N/V, bleeding appropriate  Objective: Vital signs in last 24 hours: Temp:  [97.5 F (36.4 C)-100.2 F (37.9 C)] 98.3 F (36.8 C) (12/07 0545) Pulse Rate:  [74-132] 88  (12/07 0545) Resp:  [16-22] 20  (12/07 0545) BP: (91-129)/(49-84) 95/61 mmHg (12/07 0545) SpO2:  [95 %-99 %] 98 % (12/07 0545)  Physical Exam:  General: alert, cooperative and appears stated age Lochia: appropriate Uterine Fundus: firm Incision: healing well DVT Evaluation: No evidence of DVT seen on physical exam.   Basename 09/27/12 0500 09/25/12 0755  HGB 8.2* 11.5*  HCT 24.8* 34.8*    Assessment/Plan: Status post Cesarean section. Doing well postoperatively.  Continue current care.  Vance Belcourt H. 09/27/2012, 8:54 AM

## 2012-09-28 MED ORDER — DOCUSATE SODIUM 100 MG PO CAPS: 100.0000 mg | ORAL_CAPSULE | Freq: Two times a day (BID) | ORAL | Status: DC

## 2012-09-28 MED ORDER — OXYCODONE-ACETAMINOPHEN 5-325 MG PO TABS: 2.0000 | ORAL_TABLET | ORAL | Status: DC | PRN

## 2012-09-28 MED ORDER — IBUPROFEN 600 MG PO TABS: 600.0000 mg | ORAL_TABLET | Freq: Four times a day (QID) | ORAL | Status: DC | PRN

## 2012-09-28 NOTE — Discharge Summary (Signed)
Obstetric Discharge Summary Reason for Admission: induction of labor Prenatal Procedures: NST and ultrasound Intrapartum Procedures: cesarean: low cervical, transverse Postpartum Procedures: none Complications-Operative and Postpartum: none Hemoglobin  Date Value Range Status  09/27/2012 8.2* 12.0 - 15.0 g/dL Final     REPEATED TO VERIFY     DELTA CHECK NOTED     HCT  Date Value Range Status  09/27/2012 24.8* 36.0 - 46.0 % Final    Physical Exam:  General: alert, cooperative and appears stated age 26: appropriate Uterine Fundus: firm Incision: healing well DVT Evaluation: No evidence of DVT seen on physical exam.  Discharge Diagnoses: Term Pregnancy-delivered  Discharge Information: Date: 09/28/2012 Activity: pelvic rest Diet: routine Medications: Ibuprofen, Colace and Percocet Condition: improved Instructions: refer to practice specific booklet Discharge to: home Follow-up Information    Follow up with Almon Hercules., MD. In 4 weeks. (For a postpartum evaluation)    Contact information:   [address] [place] [address] [phone]          Newborn Data: Live born female  Birth Weight: 7 lb 10.9 oz (3485 g) APGAR: 9, 9  Home with mother.  Kunal Levario H. 09/28/2012, 9:49 AM

## 2012-09-29 ENCOUNTER — Encounter (HOSPITAL_COMMUNITY): Payer: Self-pay | Admitting: Obstetrics and Gynecology

## 2012-09-29 LAB — TYPE AND SCREEN: Unit division: 0

## 2013-01-29 LAB — OB RESULTS CONSOLE RUBELLA ANTIBODY, IGM: Rubella: IMMUNE

## 2013-01-29 LAB — OB RESULTS CONSOLE ANTIBODY SCREEN: Antibody Screen: NEGATIVE

## 2013-01-29 LAB — OB RESULTS CONSOLE ABO/RH

## 2013-01-29 LAB — OB RESULTS CONSOLE HIV ANTIBODY (ROUTINE TESTING)
HIV: NONREACTIVE
HIV: NONREACTIVE

## 2013-01-29 LAB — OB RESULTS CONSOLE RPR: RPR: NONREACTIVE

## 2013-07-27 LAB — OB RESULTS CONSOLE GBS
GBS: POSITIVE
GBS: POSITIVE

## 2013-08-05 ENCOUNTER — Encounter (HOSPITAL_COMMUNITY): Payer: Self-pay | Admitting: Pharmacist

## 2013-08-07 NOTE — Progress Notes (Signed)
Office called to request physician orders be placed, left message for [facility]

## 2013-08-10 ENCOUNTER — Encounter (HOSPITAL_COMMUNITY)
Admission: RE | Admit: 2013-08-10 | Discharge: 2013-08-10 | Disposition: A | Discharge status: Home / Self Care | Payer: Managed Care, Other (non HMO) | Source: Ambulatory Visit | Attending: Obstetrics and Gynecology | Admitting: Obstetrics and Gynecology

## 2013-08-10 ENCOUNTER — Encounter (HOSPITAL_COMMUNITY): Payer: Self-pay

## 2013-08-10 HISTORY — DX: Other specified pregnancy related conditions, unspecified trimester: O26.899

## 2013-08-10 HISTORY — DX: Heartburn: R12

## 2013-08-10 HISTORY — DX: Headache: R51

## 2013-08-10 LAB — CBC
Hemoglobin: 10.2 g/dL — ABNORMAL LOW (ref 12.0–15.0)
MCH: 25.4 pg — ABNORMAL LOW (ref 26.0–34.0)
MCHC: 31.5 g/dL (ref 30.0–36.0)
RDW: 15.4 % (ref 11.5–15.5)
WBC: 10.9 10*3/uL — ABNORMAL HIGH (ref 4.0–10.5)

## 2013-08-10 LAB — TYPE AND SCREEN
ABO/RH(D): O POS
Antibody Screen: NEGATIVE

## 2013-08-10 NOTE — Patient Instructions (Signed)
Your procedure is scheduled on:08/12/13  Enter through the Main Entrance at :8am Pick up desk phone and dial [phone] and inform us of your arrival.  Please call [phone] if you have any problems the morning of surgery.  Remember: Do not eat food or drink liquids, including water, after midnight:Tuesday   You may brush your teeth the morning of surgery.   DO NOT wear jewelry, eye make-up, lipstick,body lotion, or dark fingernail polish.  (Polished toes are ok) You may wear deodorant.  If you are to be admitted after surgery, leave suitcase in car until your room has been assigned. Patients discharged on the day of surgery will not be allowed to drive home. Wear loose fitting, comfortable clothes for your ride home.

## 2013-08-12 ENCOUNTER — Inpatient Hospital Stay (HOSPITAL_COMMUNITY): Payer: Managed Care, Other (non HMO) | Admitting: Anesthesiology

## 2013-08-12 ENCOUNTER — Inpatient Hospital Stay (HOSPITAL_COMMUNITY)
Admission: AD | Admit: 2013-08-12 | Discharge: 2013-08-14 | DRG: 766 | Disposition: A | Discharge status: Home / Self Care | Payer: Managed Care, Other (non HMO) | Source: Ambulatory Visit | Attending: Obstetrics and Gynecology | Admitting: Obstetrics and Gynecology

## 2013-08-12 ENCOUNTER — Encounter (HOSPITAL_COMMUNITY)
Admission: AD | Disposition: A | Discharge status: Home / Self Care | Payer: Self-pay | Source: Ambulatory Visit | Attending: Obstetrics and Gynecology

## 2013-08-12 ENCOUNTER — Encounter (HOSPITAL_COMMUNITY): Payer: Managed Care, Other (non HMO) | Admitting: Anesthesiology

## 2013-08-12 ENCOUNTER — Encounter (HOSPITAL_COMMUNITY): Payer: Self-pay | Admitting: *Deleted

## 2013-08-12 DIAGNOSIS — O34219 Maternal care for unspecified type scar from previous cesarean delivery: Principal | ICD-10-CM | POA: Diagnosis present

## 2013-08-12 DIAGNOSIS — O99892 Other specified diseases and conditions complicating childbirth: Secondary | ICD-10-CM | POA: Diagnosis present

## 2013-08-12 DIAGNOSIS — Z22330 Carrier of Group B streptococcus: Secondary | ICD-10-CM

## 2013-08-12 SURGERY — Surgical Case
Anesthesia: Spinal | Site: Abdomen | Wound class: Clean Contaminated

## 2013-08-12 MED ORDER — LACTATED RINGERS IV SOLN
Freq: Once | INTRAVENOUS | Status: AC
  Administered 2013-08-12: 08:00:00 via INTRAVENOUS

## 2013-08-12 MED ORDER — ZOLPIDEM TARTRATE 5 MG PO TABS: 5.0000 mg | ORAL_TABLET | Freq: Every evening | ORAL | Status: DC | PRN

## 2013-08-12 MED ORDER — GENTAMICIN SULFATE 40 MG/ML IJ SOLN
INTRAMUSCULAR | Status: AC
  Administered 2013-08-12: 100 mL via INTRAVENOUS
  Filled 2013-08-12: qty 8.25

## 2013-08-12 MED ORDER — SENNOSIDES-DOCUSATE SODIUM 8.6-50 MG PO TABS
2.0000 | ORAL_TABLET | ORAL | Status: DC
  Administered 2013-08-13 (×2): 2 via ORAL
  Filled 2013-08-12 (×2): qty 2

## 2013-08-12 MED ORDER — OXYTOCIN 40 UNITS IN LACTATED RINGERS INFUSION - SIMPLE MED: 62.5000 mL/h | INTRAVENOUS | Status: AC

## 2013-08-12 MED ORDER — METHYLERGONOVINE MALEATE 0.2 MG/ML IJ SOLN: 0.2000 mg | INTRAMUSCULAR | Status: DC | PRN

## 2013-08-12 MED ORDER — DIPHENHYDRAMINE HCL 50 MG/ML IJ SOLN
INTRAMUSCULAR | Status: DC | PRN
  Administered 2013-08-12: 25 mg via INTRAVENOUS

## 2013-08-12 MED ORDER — FENTANYL CITRATE 0.05 MG/ML IJ SOLN: 25.0000 ug | INTRAMUSCULAR | Status: DC | PRN

## 2013-08-12 MED ORDER — NALBUPHINE HCL 10 MG/ML IJ SOLN
5.0000 mg | INTRAMUSCULAR | Status: DC | PRN
  Filled 2013-08-12: qty 1

## 2013-08-12 MED ORDER — PROMETHAZINE HCL 25 MG/ML IJ SOLN: 6.2500 mg | INTRAMUSCULAR | Status: DC | PRN

## 2013-08-12 MED ORDER — EPHEDRINE SULFATE 50 MG/ML IJ SOLN
INTRAMUSCULAR | Status: DC | PRN
  Administered 2013-08-12 (×5): 10 mg via INTRAVENOUS

## 2013-08-12 MED ORDER — SIMETHICONE 80 MG PO CHEW
80.0000 mg | CHEWABLE_TABLET | ORAL | Status: DC
  Administered 2013-08-13 (×2): 80 mg via ORAL
  Filled 2013-08-12 (×2): qty 1

## 2013-08-12 MED ORDER — MORPHINE SULFATE (PF) 0.5 MG/ML IJ SOLN
INTRAMUSCULAR | Status: DC | PRN
  Administered 2013-08-12: .15 mg via INTRATHECAL

## 2013-08-12 MED ORDER — ONDANSETRON HCL 4 MG/2ML IJ SOLN: 4.0000 mg | Freq: Three times a day (TID) | INTRAMUSCULAR | Status: DC | PRN

## 2013-08-12 MED ORDER — METOCLOPRAMIDE HCL 5 MG/ML IJ SOLN: 10.0000 mg | Freq: Three times a day (TID) | INTRAMUSCULAR | Status: DC | PRN

## 2013-08-12 MED ORDER — MORPHINE SULFATE 0.5 MG/ML IJ SOLN
INTRAMUSCULAR | Status: AC
  Filled 2013-08-12: qty 10

## 2013-08-12 MED ORDER — SIMETHICONE 80 MG PO CHEW: 80.0000 mg | CHEWABLE_TABLET | ORAL | Status: DC | PRN

## 2013-08-12 MED ORDER — DIBUCAINE 1 % RE OINT: 1.0000 "application " | TOPICAL_OINTMENT | RECTAL | Status: DC | PRN

## 2013-08-12 MED ORDER — OXYCODONE-ACETAMINOPHEN 5-325 MG PO TABS
1.0000 | ORAL_TABLET | ORAL | Status: DC | PRN
  Administered 2013-08-13: 1 via ORAL
  Administered 2013-08-13: 2 via ORAL
  Administered 2013-08-13 (×2): 1 via ORAL
  Administered 2013-08-14 (×2): 2 via ORAL
  Filled 2013-08-12 (×2): qty 1
  Filled 2013-08-12 (×3): qty 2
  Filled 2013-08-12: qty 1

## 2013-08-12 MED ORDER — MEPERIDINE HCL 25 MG/ML IJ SOLN: 6.2500 mg | INTRAMUSCULAR | Status: DC | PRN

## 2013-08-12 MED ORDER — TETANUS-DIPHTH-ACELL PERTUSSIS 5-2.5-18.5 LF-MCG/0.5 IM SUSP: 0.5000 mL | Freq: Once | INTRAMUSCULAR | Status: DC

## 2013-08-12 MED ORDER — MENTHOL 3 MG MT LOZG: 1.0000 | LOZENGE | OROMUCOSAL | Status: DC | PRN

## 2013-08-12 MED ORDER — DIPHENHYDRAMINE HCL 50 MG/ML IJ SOLN: 12.5000 mg | INTRAMUSCULAR | Status: DC | PRN

## 2013-08-12 MED ORDER — KETOROLAC TROMETHAMINE 30 MG/ML IJ SOLN
INTRAMUSCULAR | Status: AC
  Filled 2013-08-12: qty 1

## 2013-08-12 MED ORDER — METOCLOPRAMIDE HCL 5 MG/ML IJ SOLN
INTRAMUSCULAR | Status: AC
  Filled 2013-08-12: qty 2

## 2013-08-12 MED ORDER — FENTANYL CITRATE 0.05 MG/ML IJ SOLN
INTRAMUSCULAR | Status: AC
  Filled 2013-08-12: qty 2

## 2013-08-12 MED ORDER — PRENATAL MULTIVITAMIN CH
1.0000 | ORAL_TABLET | Freq: Every day | ORAL | Status: DC
  Administered 2013-08-13 – 2013-08-14 (×3): 1 via ORAL
  Filled 2013-08-12 (×2): qty 1

## 2013-08-12 MED ORDER — MIDAZOLAM HCL 2 MG/2ML IJ SOLN: 0.5000 mg | Freq: Once | INTRAMUSCULAR | Status: DC | PRN

## 2013-08-12 MED ORDER — OXYTOCIN 10 UNIT/ML IJ SOLN
INTRAMUSCULAR | Status: AC
  Filled 2013-08-12: qty 4

## 2013-08-12 MED ORDER — FENTANYL CITRATE 0.05 MG/ML IJ SOLN
INTRAMUSCULAR | Status: DC | PRN
  Administered 2013-08-12: 25 ug via INTRATHECAL

## 2013-08-12 MED ORDER — ONDANSETRON HCL 4 MG/2ML IJ SOLN: 4.0000 mg | INTRAMUSCULAR | Status: DC | PRN

## 2013-08-12 MED ORDER — SCOPOLAMINE 1 MG/3DAYS TD PT72
1.0000 | MEDICATED_PATCH | Freq: Once | TRANSDERMAL | Status: DC
  Administered 2013-08-12: 1.5 mg via TRANSDERMAL

## 2013-08-12 MED ORDER — ONDANSETRON HCL 4 MG/2ML IJ SOLN
INTRAMUSCULAR | Status: DC | PRN
  Administered 2013-08-12: 4 mg via INTRAVENOUS

## 2013-08-12 MED ORDER — IBUPROFEN 600 MG PO TABS
600.0000 mg | ORAL_TABLET | Freq: Four times a day (QID) | ORAL | Status: DC
  Administered 2013-08-12 – 2013-08-14 (×8): 600 mg via ORAL
  Filled 2013-08-12 (×8): qty 1

## 2013-08-12 MED ORDER — ONDANSETRON HCL 4 MG PO TABS: 4.0000 mg | ORAL_TABLET | ORAL | Status: DC | PRN

## 2013-08-12 MED ORDER — LACTATED RINGERS IV SOLN: INTRAVENOUS | Status: DC

## 2013-08-12 MED ORDER — EPHEDRINE 5 MG/ML INJ
INTRAVENOUS | Status: AC
  Filled 2013-08-12: qty 10

## 2013-08-12 MED ORDER — SCOPOLAMINE 1 MG/3DAYS TD PT72
1.0000 | MEDICATED_PATCH | Freq: Once | TRANSDERMAL | Status: DC
  Filled 2013-08-12: qty 1

## 2013-08-12 MED ORDER — LACTATED RINGERS IV SOLN
INTRAVENOUS | Status: DC
  Administered 2013-08-12: 17:00:00 via INTRAVENOUS

## 2013-08-12 MED ORDER — DIPHENHYDRAMINE HCL 25 MG PO CAPS
25.0000 mg | ORAL_CAPSULE | Freq: Four times a day (QID) | ORAL | Status: DC | PRN
  Filled 2013-08-12: qty 1

## 2013-08-12 MED ORDER — LACTATED RINGERS IV SOLN
INTRAVENOUS | Status: DC
  Administered 2013-08-12 (×2): via INTRAVENOUS

## 2013-08-12 MED ORDER — DIPHENHYDRAMINE HCL 25 MG PO CAPS
25.0000 mg | ORAL_CAPSULE | ORAL | Status: DC | PRN
  Administered 2013-08-13: 25 mg via ORAL

## 2013-08-12 MED ORDER — SIMETHICONE 80 MG PO CHEW
80.0000 mg | CHEWABLE_TABLET | Freq: Three times a day (TID) | ORAL | Status: DC
  Administered 2013-08-12 – 2013-08-14 (×5): 80 mg via ORAL
  Filled 2013-08-12 (×5): qty 1

## 2013-08-12 MED ORDER — METHYLERGONOVINE MALEATE 0.2 MG PO TABS: 0.2000 mg | ORAL_TABLET | ORAL | Status: DC | PRN

## 2013-08-12 MED ORDER — BUPIVACAINE IN DEXTROSE 0.75-8.25 % IT SOLN
INTRATHECAL | Status: DC | PRN
  Administered 2013-08-12: 1.4 mL via INTRATHECAL

## 2013-08-12 MED ORDER — KETOROLAC TROMETHAMINE 30 MG/ML IJ SOLN: 30.0000 mg | Freq: Four times a day (QID) | INTRAMUSCULAR | Status: AC | PRN

## 2013-08-12 MED ORDER — SCOPOLAMINE 1 MG/3DAYS TD PT72
MEDICATED_PATCH | TRANSDERMAL | Status: AC
  Filled 2013-08-12: qty 1

## 2013-08-12 MED ORDER — METOCLOPRAMIDE HCL 5 MG/ML IJ SOLN
INTRAMUSCULAR | Status: DC | PRN
  Administered 2013-08-12 (×2): 5 mg via INTRAVENOUS

## 2013-08-12 MED ORDER — KETOROLAC TROMETHAMINE 30 MG/ML IJ SOLN
30.0000 mg | Freq: Four times a day (QID) | INTRAMUSCULAR | Status: AC | PRN
  Administered 2013-08-12: 30 mg via INTRAMUSCULAR

## 2013-08-12 MED ORDER — DIPHENHYDRAMINE HCL 50 MG/ML IJ SOLN: 25.0000 mg | INTRAMUSCULAR | Status: DC | PRN

## 2013-08-12 MED ORDER — WITCH HAZEL-GLYCERIN EX PADS: 1.0000 "application " | MEDICATED_PAD | CUTANEOUS | Status: DC | PRN

## 2013-08-12 MED ORDER — LANOLIN HYDROUS EX OINT: 1.0000 "application " | TOPICAL_OINTMENT | CUTANEOUS | Status: DC | PRN

## 2013-08-12 MED ORDER — NALOXONE HCL 0.4 MG/ML IJ SOLN: 0.4000 mg | INTRAMUSCULAR | Status: DC | PRN

## 2013-08-12 MED ORDER — OXYTOCIN 10 UNIT/ML IJ SOLN
40.0000 [IU] | INTRAVENOUS | Status: DC | PRN
  Administered 2013-08-12: 40 [IU] via INTRAVENOUS

## 2013-08-12 MED ORDER — DEXTROSE 5 % IV SOLN
1.0000 ug/kg/h | INTRAVENOUS | Status: DC | PRN
  Filled 2013-08-12: qty 2

## 2013-08-12 MED ORDER — PHENYLEPHRINE HCL 10 MG/ML IJ SOLN
INTRAMUSCULAR | Status: AC
  Filled 2013-08-12: qty 1

## 2013-08-12 MED ORDER — SODIUM CHLORIDE 0.9 % IJ SOLN: 3.0000 mL | INTRAMUSCULAR | Status: DC | PRN

## 2013-08-12 MED ORDER — DIPHENHYDRAMINE HCL 50 MG/ML IJ SOLN
INTRAMUSCULAR | Status: AC
  Filled 2013-08-12: qty 1

## 2013-08-12 MED ORDER — PHENYLEPHRINE 8 MG IN D5W 100 ML (0.08MG/ML) PREMIX OPTIME
INJECTION | INTRAVENOUS | Status: DC | PRN
  Administered 2013-08-12: 40 ug/min via INTRAVENOUS

## 2013-08-12 SURGICAL SUPPLY — 31 items
CLAMP CORD UMBIL (MISCELLANEOUS) ×2 IMPLANT
CLOTH BEACON ORANGE TIMEOUT ST (SAFETY) ×2 IMPLANT
DERMABOND ADVANCED (GAUZE/BANDAGES/DRESSINGS) ×1
DERMABOND ADVANCED .7 DNX12 (GAUZE/BANDAGES/DRESSINGS) ×1 IMPLANT
DRAPE LG THREE QUARTER DISP (DRAPES) ×4 IMPLANT
DRSG OPSITE POSTOP 4X10 (GAUZE/BANDAGES/DRESSINGS) ×2 IMPLANT
DURAPREP 26ML APPLICATOR (WOUND CARE) ×4 IMPLANT
ELECT REM PT RETURN 9FT ADLT (ELECTROSURGICAL) ×2
ELECTRODE REM PT RTRN 9FT ADLT (ELECTROSURGICAL) ×1 IMPLANT
EXTRACTOR VACUUM M CUP 4 TUBE (SUCTIONS) IMPLANT
GLOVE BIO SURGEON STRL SZ7 (GLOVE) ×2 IMPLANT
GLOVE SURG SS PI 7.0 STRL IVOR (GLOVE) ×4 IMPLANT
GLOVE SURG SS PI 7.5 STRL IVOR (GLOVE) ×2 IMPLANT
GLOVE SURG SS PI 8.0 STRL IVOR (GLOVE) ×2 IMPLANT
GOWN STRL REIN XL XLG (GOWN DISPOSABLE) ×4 IMPLANT
KIT ABG SYR 3ML LUER SLIP (SYRINGE) IMPLANT
NEEDLE HYPO 25X5/8 SAFETYGLIDE (NEEDLE) ×2 IMPLANT
NS IRRIG 1000ML POUR BTL (IV SOLUTION) ×2 IMPLANT
PACK C SECTION WH (CUSTOM PROCEDURE TRAY) ×2 IMPLANT
PAD OB MATERNITY 4.3X12.25 (PERSONAL CARE ITEMS) ×2 IMPLANT
RTRCTR C-SECT PINK 25CM LRG (MISCELLANEOUS) ×2 IMPLANT
STAPLER VISISTAT 35W (STAPLE) IMPLANT
SUT CHROMIC 1 CTX 36 (SUTURE) ×6 IMPLANT
SUT PDS AB 0 CTX 60 (SUTURE) ×2 IMPLANT
SUT PLAIN 2 0 XLH (SUTURE) ×2 IMPLANT
SUT VIC AB 2-0 CT1 27 (SUTURE) ×1
SUT VIC AB 2-0 CT1 TAPERPNT 27 (SUTURE) ×1 IMPLANT
SUT VIC AB 4-0 KS 27 (SUTURE) ×2 IMPLANT
TOWEL OR 17X24 6PK STRL BLUE (TOWEL DISPOSABLE) ×2 IMPLANT
TRAY FOLEY CATH 14FR (SET/KITS/TRAYS/PACK) ×2 IMPLANT
WATER STERILE IRR 1000ML POUR (IV SOLUTION) ×2 IMPLANT

## 2013-08-12 NOTE — H&P (Signed)
Kourtnee Rosland Riding is a 27 y.o. female presenting for repeat cesarean section  27 yo G2P1001 @ 39+0 presents for repeat cesarean section. Her pregnancy has been uncomplicated except for a short interval between deliveries History OB History   Grav Para Term Preterm Abortions TAB SAB Ect Mult Living   2 1 1  0 0 0 0 0 0 1     Past Medical History  Diagnosis Date  . H/O varicella   . Headache(784.0)     h/o migraines as a child  . Heartburn in pregnancy    Past Surgical History  Procedure Laterality Date  . No past surgeries    . Cesarean section  09/26/2012    Procedure: CESAREAN SECTION;  Surgeon: Freddrick March. Tenny Craw, MD;  Location: [facility];  Service: Obstetrics;  Laterality: N/A;   Family History: family history is not on file. She was adopted. Social History:  reports that she has never smoked. She has never used smokeless tobacco. She reports that she does not drink alcohol or use illicit drugs.   Prenatal Transfer Tool  Maternal Diabetes: No Genetic Screening: Declined Maternal Ultrasounds/Referrals: Normal Fetal Ultrasounds or other Referrals:  None Maternal Substance Abuse:  No Significant Maternal Medications:  None Significant Maternal Lab Results:  None Other Comments:  None  ROS: as above    Blood pressure 111/80, pulse 101, temperature 98.1 F (36.7 C), temperature source Oral, resp. rate 20, height 5\' 1"  (1.549 m), weight 95.255 kg (210 lb), last menstrual period 12/25/2011, SpO2 99.00%, currently breastfeeding. Exam Physical Exam  Prenatal labs: ABO, Rh: --/--/O POS (10/20 1610) Antibody: NEG (10/20 0905) Rubella: Immune (04/10 0000) RPR: NON REACTIVE (10/20 0905)  HBsAg: Negative (04/10 0000)  HIV: Non-reactive (04/10 1359)  GBS: Positive (10/06 1352)   Assessment/Plan: 1) Admit 2) Consent 3) PCN allergy, Clinda + gent OCTOR 4) SCDs   Jenniefer Salak H. 08/12/2013, 9:24 AM

## 2013-08-12 NOTE — Transfer of Care (Signed)
Immediate Anesthesia Transfer of Care Note  Patient: Savannah Chavez  Procedure(s) Performed: Procedure(s): REPEAT CESAREAN SECTION with delivery of baby boy @ 80; Apgars 8/9 (N/A)  Patient Location: PACU  Anesthesia Type:Spinal  Level of Consciousness: awake, alert  and oriented  Airway & Oxygen Therapy: Patient Spontanous Breathing  Post-op Assessment: Report given to PACU RN and Post -op Vital signs reviewed and stable  Post vital signs: Reviewed and stable  Complications: No apparent anesthesia complications

## 2013-08-12 NOTE — Anesthesia Postprocedure Evaluation (Signed)
Anesthesia Post Note  Patient: Savannah Chavez  Procedure(s) Performed: Procedure(s) (LRB): REPEAT CESAREAN SECTION with delivery of baby boy @ 53; Apgars 8/9 (N/A)  Anesthesia type: Spinal  Patient location: Mother/Baby  Post pain: Pain level controlled  Post assessment: Post-op Vital signs reviewed  Last Vitals:  Filed Vitals:   08/12/13 1521  BP: 104/67  Pulse: 105  Temp: 37.2 C  Resp: 16    Post vital signs: Reviewed  Level of consciousness: awake  Complications: No apparent anesthesia complications

## 2013-08-12 NOTE — Anesthesia Preprocedure Evaluation (Signed)
Anesthesia Evaluation  Patient identified by MRN, date of birth, ID band Patient awake    Reviewed: Allergy & Precautions, H&P , NPO status , Patient's Chart, lab work & pertinent test results  Airway Mallampati: II      Dental no notable dental hx.    Pulmonary neg pulmonary ROS,  breath sounds clear to auscultation  Pulmonary exam normal       Cardiovascular Exercise Tolerance: Good negative cardio ROS  Rhythm:regular Rate:Normal     Neuro/Psych  Headaches, negative neurological ROS  negative psych ROS   GI/Hepatic negative GI ROS, Neg liver ROS,   Endo/Other  negative endocrine ROSMorbid obesity  Renal/GU negative Renal ROS  negative genitourinary   Musculoskeletal   Abdominal Normal abdominal exam  (+)   Peds  Hematology negative hematology ROS (+)   Anesthesia Other Findings   Reproductive/Obstetrics (+) Pregnancy                           Anesthesia Physical Anesthesia Plan  ASA: III  Anesthesia Plan: Spinal   Post-op Pain Management:    Induction:   Airway Management Planned:   Additional Equipment:   Intra-op Plan:   Post-operative Plan:   Informed Consent: I have reviewed the patients History and Physical, chart, labs and discussed the procedure including the risks, benefits and alternatives for the proposed anesthesia with the patient or authorized representative who has indicated his/her understanding and acceptance.     Plan Discussed with: Anesthesiologist, CRNA and Surgeon  Anesthesia Plan Comments:         Anesthesia Quick Evaluation

## 2013-08-12 NOTE — Anesthesia Postprocedure Evaluation (Signed)
  Anesthesia Post-op Note  Anesthesia Post Note  Patient: Savannah Chavez  Procedure(s) Performed: Procedure(s) (LRB): REPEAT CESAREAN SECTION with delivery of baby boy @ 23; Apgars 8/9 (N/A)  Anesthesia type: Spinal  Patient location: PACU  Post pain: Pain level controlled  Post assessment: Post-op Vital signs reviewed  Post vital signs: Reviewed  Level of consciousness: awake  Complications: No apparent anesthesia complications

## 2013-08-12 NOTE — Consult Note (Signed)
Neonatology Note:  Attendance at C-section:  I was asked by Dr. Ross to attend this repeat C/S at term. The mother is a G2P1 O pos, GBS positive with an uncomplicated pregnancy. ROM at delivery, fluid clear. Infant vigorous with good spontaneous cry and tone. Needed only minimal bulb suctioning. Ap 8/9. Lungs clear to ausc in DR. To CN to care of Pediatrician.  Sha Amer C. Maily Debarge, MD  

## 2013-08-12 NOTE — Anesthesia Procedure Notes (Signed)
Spinal  Patient location during procedure: OR Start time: 08/12/2013 9:47 AM Staffing Anesthesiologist: Angus Seller., Harrell Gave. Performed by: anesthesiologist  Preanesthetic Checklist Completed: patient identified, site marked, surgical consent, pre-op evaluation, timeout performed, IV checked, risks and benefits discussed and monitors and equipment checked Spinal Block Patient position: sitting Prep: DuraPrep Patient monitoring: heart rate, cardiac monitor, continuous pulse ox and blood pressure Approach: midline Location: L3-4 Injection technique: single-shot Needle Needle type: Sprotte  Needle gauge: 24 G Needle length: 9 cm Assessment Sensory level: T4 Additional Notes Patient identified.  Risk benefits discussed including failed block, incomplete pain control, headache, nerve damage, paralysis, blood pressure changes, nausea, vomiting, reactions to medication both toxic or allergic, and postpartum back pain.  Patient expressed understanding and wished to proceed.  All questions were answered.  Sterile technique used throughout procedure.  CSF was clear.  No parasthesia or other complications.  Please see nursing notes for vital signs.

## 2013-08-12 NOTE — Op Note (Signed)
Pre-Operative Diagnosis: 1) 39 week intrauterine pregnancy 2) History of prior cesarean section, desires repeat 3) Short pregnancy interval Postoperative Diagnosis: same Procedure: Repeat low transverse cesarean section Surgeon: Dr. Waynard Reeds Assistant: Philip Aspen, DO Operative Findings: Vigorous female infant in vertex presentation with apgars of 9 & 9. Normal ovaries and tubes. Specimen: Placenta for disposal EBL: Total I/O In: 2500 [I.V.:2500] Out: 700 [Urine:100; Blood:600]   Procedure:Savannah Chavez is an 27 year old gravida 2 para 1001 at 75 weeks and 0 days estimated gestational age who presents for cesarean section. Following the appropriate informed consent the patient was brought to the operating room where spinal anesthesia was administered and found to be adequate. She was placed in the dorsal supine position with a leftward tilt. She was prepped and draped in the normal sterile fashion. Scalpel was then used to make a Pfannenstiel skin incision which was carried down to the underlying layers of soft tissue to the fascia. The fascia was incised in the midline and the fascial incision was extended laterally with Mayo scissors. The superior aspect of the fascial incision was grasped with Coker clamps x2, tented up and the rectus muscles dissected off sharply with the electrocautery unit area and the same procedure was repeated on the inferior aspect of the fascial incision. The rectus muscles were separated in the midline. The abdominal peritoneum was identified, tented up, entered sharply, and the incision was extended superiorly and inferiorly with good visualization of the bladder. The Alexis retractor was then deployed.The vesicouterine peritoneal was very low and the decision was made to not create a bladder flap. The scalpel was then used to make a low transverse incision on the uterus which was extended laterally with blunt dissection. The fetal vertex was identified, delivered easily  through the uterine incision followed by the body. The infant was bulb suctioned on the operative field cried vigorously, cord was clamped and cut and the infant was passed to the waiting neonatologist. Placenta was then delivered spontaneously, the uterus was cleared of all clot and debris. A midline extension of the uterine incision was first repaired with #1 chromic in a running locked fashion.  The uterine incision was repaired with #1 chromic in running locked fashion followed by a second imbricating layer. Ovaries and tubes were inspected and normal. The Alexis retractor was removed. The abdominal peritoneum was reapproximated with 2-0 Vicryl in a running fashion, the rectus muscles was reapproximated with #1 chromic in a running fashion. The fascia was closed with a looped PDS in a running fashion. The subcutaneous tissue was reapproximated with 2-0 plain gut interrupted stitches.The skin was closed with 4-0 vicryl in a subcuticular fashion and Dermabond. All sponge lap and needle counts were correct x2. Patient tolerated the procedure well and recovered in stable condition following the procedure.

## 2013-08-13 ENCOUNTER — Encounter (HOSPITAL_COMMUNITY): Payer: Self-pay | Admitting: Obstetrics and Gynecology

## 2013-08-13 LAB — CBC
Hemoglobin: 8.8 g/dL — ABNORMAL LOW (ref 12.0–15.0)
MCH: 26 pg (ref 26.0–34.0)
MCHC: 31.7 g/dL (ref 30.0–36.0)
MCV: 82.2 fL (ref 78.0–100.0)
Platelets: 204 10*3/uL (ref 150–400)
RBC: 3.38 MIL/uL — ABNORMAL LOW (ref 3.87–5.11)
WBC: 12.5 10*3/uL — ABNORMAL HIGH (ref 4.0–10.5)

## 2013-08-13 LAB — BIRTH TISSUE RECOVERY COLLECTION (PLACENTA DONATION)

## 2013-08-13 LAB — CCBB MATERNAL DONOR DRAW

## 2013-08-13 MED ORDER — FERROUS SULFATE 325 (65 FE) MG PO TABS
325.0000 mg | ORAL_TABLET | Freq: Two times a day (BID) | ORAL | Status: DC
  Administered 2013-08-13 – 2013-08-14 (×3): 325 mg via ORAL
  Filled 2013-08-13 (×3): qty 1

## 2013-08-13 NOTE — Lactation Note (Signed)
This note was copied from the chart of Savannah Silver Carton. Lactation Consultation Note Mom states breast feeding is going very well; denies pain; states she has no questions or concerns at this time. Dad holding baby, baby asleep, mom states baby just had a good feeding. Enc mom to call the lactation office (number provided) if she has any concerns, and to attend the BFSG.  Patient Name: Savannah Chavez RUEAV'W Date: 08/13/2013     Maternal Data    Feeding Length of feed: 15 min  LATCH Score/Interventions                      Lactation Tools Discussed/Used     Consult Status      Savannah Chavez 08/13/2013, 2:58 PM

## 2013-08-13 NOTE — Anesthesia Postprocedure Evaluation (Signed)
Anesthesia Post Note  Patient: Savannah Chavez  Procedure(s) Performed: Procedure(s) (LRB): REPEAT CESAREAN SECTION with delivery of baby boy @ 15; Apgars 8/9 (N/A)  Anesthesia type: Epidural  Patient location: Mother/Baby  Post pain: Pain level controlled  Post assessment: Post-op Vital signs reviewed  Last Vitals:  Filed Vitals:   08/13/13 0607  BP: 99/63  Pulse: 96  Temp: 37.2 C  Resp: 18    Post vital signs: Reviewed  Level of consciousness:alert  Complications: No apparent anesthesia complications

## 2013-08-13 NOTE — Progress Notes (Signed)
  Patient is eating, ambulating, voiding.  Pain control is good.  Filed Vitals:   08/12/13 2116 08/13/13 0103 08/13/13 0537 08/13/13 0607  BP: 101/66 100/69 110/71 99/63  Pulse: 80 89 108 96  Temp: 98.7 F (37.1 C) 98 F (36.7 C) 97.9 F (36.6 C) 98.9 F (37.2 C)  TempSrc: Oral Oral Oral Oral  Resp: 16 20 16 18   Height:      Weight:      SpO2: 96% 97%  97%    lungs:   clear to auscultation cor:    RRR Abdomen:  soft, appropriate tenderness, incisions intact and without erythema or exudate ex:    no cords   Lab Results  Component Value Date   WBC 12.5* 08/13/2013   HGB 8.8* 08/13/2013   HCT 27.8* 08/13/2013   MCV 82.2 08/13/2013   PLT 204 08/13/2013    --/--/O POS (10/20 0905)/RI  A/P    Post operative day 1.  Routine post op and postpartum care.  Expect d/c tomorrow.  Percocet for pain control. Iron for anemia.

## 2013-08-13 NOTE — Progress Notes (Signed)
Parents desires circumsision.  All risks, benefits and alternatives discussed with the mother. 

## 2013-08-14 MED ORDER — OXYCODONE-ACETAMINOPHEN 5-325 MG PO TABS: 1.0000 | ORAL_TABLET | ORAL | Status: DC | PRN

## 2013-08-14 MED ORDER — IBUPROFEN 600 MG PO TABS: 600.0000 mg | ORAL_TABLET | Freq: Four times a day (QID) | ORAL | Status: DC

## 2013-08-14 NOTE — Discharge Summary (Signed)
Obstetric Discharge Summary Reason for Admission: cesarean section Prenatal Procedures: ultrasound Intrapartum Procedures: cesarean: low cervical, transverse Postpartum Procedures: none Complications-Operative and Postpartum: none Hemoglobin  Date Value Range Status  08/13/2013 8.8* 12.0 - 15.0 g/dL Final     HCT  Date Value Range Status  08/13/2013 27.8* 36.0 - 46.0 % Final    Physical Exam:  General: alert Lochia: appropriate Uterine Fundus: firm Incision: healing well DVT Evaluation: No evidence of DVT seen on physical exam.  Discharge Diagnoses: Term Pregnancy-delivered, Repeat C/S  Discharge Information: Date: 08/14/2013 Activity: Limited Diet: routine Medications: PNV, Ibuprofen and Percocet Condition: stable Instructions: refer to practice specific booklet Discharge to: home Follow-up Information   Follow up with Almon Hercules., MD. Schedule an appointment as soon as possible for a visit in 4 weeks.   Specialty:  Obstetrics and Gynecology   Contact information:   [address] [place] [address] [phone]       Newborn Data: Live born female  Birth Weight: 7 lb 10.8 oz (3480 g) APGAR: 8, 9  Home with mother.  Thena Devora D 08/14/2013, 9:24 AM

## 2014-08-23 ENCOUNTER — Encounter (HOSPITAL_COMMUNITY): Payer: Self-pay | Admitting: Obstetrics and Gynecology

## 2019-04-03 LAB — OB RESULTS CONSOLE ABO/RH: RH Type: POSITIVE

## 2019-04-03 LAB — OB RESULTS CONSOLE RPR: RPR: NONREACTIVE

## 2019-04-03 LAB — OB RESULTS CONSOLE GC/CHLAMYDIA
Chlamydia: NEGATIVE
Gonorrhea: NEGATIVE

## 2019-04-03 LAB — OB RESULTS CONSOLE RUBELLA ANTIBODY, IGM: Rubella: IMMUNE

## 2019-04-03 LAB — OB RESULTS CONSOLE HEPATITIS B SURFACE ANTIGEN: Hepatitis B Surface Ag: NEGATIVE

## 2019-04-03 LAB — OB RESULTS CONSOLE HIV ANTIBODY (ROUTINE TESTING): HIV: NONREACTIVE

## 2019-04-03 LAB — OB RESULTS CONSOLE ANTIBODY SCREEN: Antibody Screen: NEGATIVE

## 2019-09-07 ENCOUNTER — Other Ambulatory Visit: Payer: Self-pay | Admitting: Obstetrics and Gynecology

## 2019-09-24 LAB — OB RESULTS CONSOLE GBS: GBS: NEGATIVE

## 2019-10-01 ENCOUNTER — Encounter (HOSPITAL_COMMUNITY): Payer: Self-pay | Admitting: *Deleted

## 2019-10-01 NOTE — Patient Instructions (Addendum)
Kalany Doreatha Offer  10/01/2019   Your procedure is scheduled on:  10/13/2019  Arrive at 2:00 PM at Entrance C on [address] at [facility]  and [employer] [employer] [address]. You are invited to use the FREE valet parking or use the Visitor's parking deck.  Pick up the phone at the desk and dial [phone].  Call this number if you have problems the morning of surgery: [phone]  Remember:   Do not eat food:(After Midnight) Desps de medianoche.  Do not drink clear liquids: (6 Hours before arrival) 6 horas ante llegada.  Take these medicines the morning of surgery with A SIP OF WATER:  none   Do not wear jewelry, make-up or nail polish.  Do not wear lotions, powders, or perfumes. Do not wear deodorant.  Do not shave 48 hours prior to surgery.  Do not bring valuables to the hospital.  [facility] is not   responsible for any belongings or valuables brought to the hospital.  Contacts, dentures or bridgework may not be worn into surgery.  Leave suitcase in the car. After surgery it may be brought to your room.  For patients admitted to the hospital, checkout time is 11:00 AM the day of              discharge.      Please read over the following fact sheets that you were given:     Preparing for Surgery

## 2019-10-02 ENCOUNTER — Encounter (HOSPITAL_COMMUNITY): Payer: Self-pay

## 2019-10-08 ENCOUNTER — Telehealth (HOSPITAL_COMMUNITY): Payer: Self-pay | Admitting: *Deleted

## 2019-10-08 ENCOUNTER — Encounter (HOSPITAL_COMMUNITY): Payer: Self-pay

## 2019-10-08 NOTE — Telephone Encounter (Signed)
Discusses pt family covid situation.    Husband testwas done on 12/10 and was positive.  Pt states she lost sense of taste and smell last week as well.  Husband is fever free currently without any medication.  Was seen in ED on 12/12 for reassurance that he was doing ok.  Not for respiratory distress.   Plan of care per Infection prevention is if he is fever free for 24 hours prior to admission and symptoms are resolving, her can come into the hospital.

## 2019-10-11 ENCOUNTER — Other Ambulatory Visit (HOSPITAL_COMMUNITY)
Admission: RE | Admit: 2019-10-11 | Discharge: 2019-10-11 | Disposition: A | Discharge status: Home / Self Care | Payer: Commercial Managed Care - PPO | Source: Ambulatory Visit | Attending: Obstetrics and Gynecology | Admitting: Obstetrics and Gynecology

## 2019-10-11 ENCOUNTER — Other Ambulatory Visit: Payer: Self-pay

## 2019-10-11 HISTORY — DX: Other specified postprocedural states: Z98.890

## 2019-10-11 HISTORY — DX: Other complications of anesthesia, initial encounter: T88.59XA

## 2019-10-11 HISTORY — DX: Other specified postprocedural states: R11.2

## 2019-10-11 LAB — TYPE AND SCREEN
ABO/RH(D): O POS
Antibody Screen: NEGATIVE

## 2019-10-11 LAB — CBC
HCT: 34.8 % — ABNORMAL LOW (ref 36.0–46.0)
Hemoglobin: 11.5 g/dL — ABNORMAL LOW (ref 12.0–15.0)
MCH: 30.4 pg (ref 26.0–34.0)
MCHC: 33 g/dL (ref 30.0–36.0)
MCV: 92.1 fL (ref 80.0–100.0)
Platelets: 212 10*3/uL (ref 150–400)
RBC: 3.78 MIL/uL — ABNORMAL LOW (ref 3.87–5.11)
RDW: 13.4 % (ref 11.5–15.5)
WBC: 8.3 10*3/uL (ref 4.0–10.5)
nRBC: 0 % (ref 0.0–0.2)

## 2019-10-11 LAB — ABO/RH: ABO/RH(D): O POS

## 2019-10-11 LAB — SARS CORONAVIRUS 2 (TAT 6-24 HRS): SARS Coronavirus 2: POSITIVE — AB

## 2019-10-11 NOTE — MAU Note (Signed)
Pt here for PAT covid swab and lab draw. States she has had a cough but only when she is laying down and that her husband tested positive for covid a little over 2 weeks ago. Denies any other symptoms and states she has been in contact with the office regarding this. Pt verbalizes understanding to not remove blood bank bracelet.

## 2019-10-12 ENCOUNTER — Telehealth (HOSPITAL_COMMUNITY): Payer: Self-pay | Admitting: *Deleted

## 2019-10-12 LAB — RPR: RPR Ser Ql: NONREACTIVE

## 2019-10-12 NOTE — Telephone Encounter (Signed)
Preadmission screen Pt And Dr Harrington Challenger office notifed of pt positive covid status.  Plan already in place with Infection prevention.  Anesthesia notified.

## 2019-10-12 NOTE — Pre-Procedure Instructions (Signed)
Pt aware of arrival time change to 2PM.  OK with Anesthesia for pt to eat light breakfast at 0700,  Pt verbalized understanding of all instruction change.

## 2019-10-13 ENCOUNTER — Inpatient Hospital Stay (HOSPITAL_COMMUNITY): Payer: Commercial Managed Care - PPO | Admitting: Anesthesiology

## 2019-10-13 ENCOUNTER — Inpatient Hospital Stay (HOSPITAL_COMMUNITY)
Admission: RE | Admit: 2019-10-13 | Discharge: 2019-10-15 | DRG: 786 | Disposition: A | Discharge status: Home / Self Care | Payer: Commercial Managed Care - PPO | Attending: Obstetrics and Gynecology | Admitting: Obstetrics and Gynecology

## 2019-10-13 ENCOUNTER — Encounter (HOSPITAL_COMMUNITY): Payer: Self-pay | Admitting: Obstetrics and Gynecology

## 2019-10-13 ENCOUNTER — Encounter (HOSPITAL_COMMUNITY)
Admission: RE | Disposition: A | Discharge status: Home / Self Care | Payer: Self-pay | Source: Home / Self Care | Attending: Obstetrics and Gynecology

## 2019-10-13 DIAGNOSIS — U071 COVID-19: Secondary | ICD-10-CM | POA: Diagnosis present

## 2019-10-13 DIAGNOSIS — O99214 Obesity complicating childbirth: Secondary | ICD-10-CM | POA: Diagnosis present

## 2019-10-13 DIAGNOSIS — O9902 Anemia complicating childbirth: Secondary | ICD-10-CM | POA: Diagnosis present

## 2019-10-13 DIAGNOSIS — O9852 Other viral diseases complicating childbirth: Secondary | ICD-10-CM | POA: Diagnosis present

## 2019-10-13 DIAGNOSIS — D649 Anemia, unspecified: Secondary | ICD-10-CM | POA: Diagnosis present

## 2019-10-13 DIAGNOSIS — Z3A39 39 weeks gestation of pregnancy: Secondary | ICD-10-CM

## 2019-10-13 DIAGNOSIS — O34211 Maternal care for low transverse scar from previous cesarean delivery: Principal | ICD-10-CM | POA: Diagnosis present

## 2019-10-13 SURGERY — Surgical Case
Anesthesia: Spinal | Site: Abdomen | Wound class: Clean Contaminated

## 2019-10-13 MED ORDER — IBUPROFEN 800 MG PO TABS
800.0000 mg | ORAL_TABLET | Freq: Four times a day (QID) | ORAL | Status: DC
  Administered 2019-10-14 – 2019-10-15 (×4): 800 mg via ORAL
  Filled 2019-10-13 (×4): qty 1

## 2019-10-13 MED ORDER — BUPIVACAINE IN DEXTROSE 0.75-8.25 % IT SOLN
INTRATHECAL | Status: DC | PRN
  Administered 2019-10-13: 1.6 mL via INTRATHECAL

## 2019-10-13 MED ORDER — SIMETHICONE 80 MG PO CHEW
80.0000 mg | CHEWABLE_TABLET | Freq: Three times a day (TID) | ORAL | Status: DC
  Administered 2019-10-14 – 2019-10-15 (×3): 80 mg via ORAL
  Filled 2019-10-13 (×2): qty 1

## 2019-10-13 MED ORDER — ONDANSETRON HCL 4 MG/2ML IJ SOLN
INTRAMUSCULAR | Status: DC | PRN
  Administered 2019-10-13: 4 mg via INTRAVENOUS

## 2019-10-13 MED ORDER — OXYTOCIN 40 UNITS IN NORMAL SALINE INFUSION - SIMPLE MED
INTRAVENOUS | Status: DC | PRN
  Administered 2019-10-13: 40 mL via INTRAVENOUS

## 2019-10-13 MED ORDER — LACTATED RINGERS IV SOLN: INTRAVENOUS | Status: DC

## 2019-10-13 MED ORDER — FENTANYL CITRATE (PF) 100 MCG/2ML IJ SOLN
INTRAMUSCULAR | Status: DC | PRN
  Administered 2019-10-13: 15 ug via INTRATHECAL

## 2019-10-13 MED ORDER — KETOROLAC TROMETHAMINE 30 MG/ML IJ SOLN
30.0000 mg | Freq: Four times a day (QID) | INTRAMUSCULAR | Status: AC
  Administered 2019-10-13 – 2019-10-14 (×3): 30 mg via INTRAVENOUS
  Filled 2019-10-13 (×3): qty 1

## 2019-10-13 MED ORDER — MORPHINE SULFATE (PF) 0.5 MG/ML IJ SOLN
INTRAMUSCULAR | Status: AC
  Filled 2019-10-13: qty 10

## 2019-10-13 MED ORDER — SIMETHICONE 80 MG PO CHEW
80.0000 mg | CHEWABLE_TABLET | ORAL | Status: DC
  Administered 2019-10-13 – 2019-10-14 (×2): 80 mg via ORAL
  Filled 2019-10-13 (×2): qty 1

## 2019-10-13 MED ORDER — METHYLERGONOVINE MALEATE 0.2 MG PO TABS: 0.2000 mg | ORAL_TABLET | ORAL | Status: DC | PRN

## 2019-10-13 MED ORDER — LACTATED RINGERS IV SOLN: INTRAVENOUS | Status: DC | PRN

## 2019-10-13 MED ORDER — PHENYLEPHRINE HCL-NACL 20-0.9 MG/250ML-% IV SOLN
INTRAVENOUS | Status: DC | PRN
  Administered 2019-10-13: 60 ug/min via INTRAVENOUS

## 2019-10-13 MED ORDER — CEFAZOLIN SODIUM-DEXTROSE 2-3 GM-%(50ML) IV SOLR
INTRAVENOUS | Status: DC | PRN
  Administered 2019-10-13: 2 g via INTRAVENOUS

## 2019-10-13 MED ORDER — SODIUM CHLORIDE 0.9 % IR SOLN
Status: DC | PRN
  Administered 2019-10-13: 1000 mL

## 2019-10-13 MED ORDER — MEPERIDINE HCL 25 MG/ML IJ SOLN: 6.2500 mg | INTRAMUSCULAR | Status: DC | PRN

## 2019-10-13 MED ORDER — KETOROLAC TROMETHAMINE 30 MG/ML IJ SOLN
30.0000 mg | Freq: Four times a day (QID) | INTRAMUSCULAR | Status: AC | PRN
  Administered 2019-10-13: 30 mg via INTRAVENOUS

## 2019-10-13 MED ORDER — PROMETHAZINE HCL 25 MG/ML IJ SOLN: 6.2500 mg | INTRAMUSCULAR | Status: DC | PRN

## 2019-10-13 MED ORDER — SODIUM CHLORIDE 0.9 % IV SOLN: INTRAVENOUS | Status: DC | PRN

## 2019-10-13 MED ORDER — OXYTOCIN 40 UNITS IN NORMAL SALINE INFUSION - SIMPLE MED
INTRAVENOUS | Status: AC
  Filled 2019-10-13: qty 1000

## 2019-10-13 MED ORDER — SIMETHICONE 80 MG PO CHEW: 80.0000 mg | CHEWABLE_TABLET | ORAL | Status: DC | PRN

## 2019-10-13 MED ORDER — FENTANYL CITRATE (PF) 100 MCG/2ML IJ SOLN
INTRAMUSCULAR | Status: AC
  Filled 2019-10-13: qty 2

## 2019-10-13 MED ORDER — SCOPOLAMINE 1 MG/3DAYS TD PT72
1.0000 | MEDICATED_PATCH | Freq: Once | TRANSDERMAL | Status: DC
  Administered 2019-10-13: 1.5 mg via TRANSDERMAL

## 2019-10-13 MED ORDER — CEFAZOLIN SODIUM-DEXTROSE 2-4 GM/100ML-% IV SOLN
INTRAVENOUS | Status: AC
  Filled 2019-10-13: qty 100

## 2019-10-13 MED ORDER — OXYTOCIN 40 UNITS IN NORMAL SALINE INFUSION - SIMPLE MED: 2.5000 [IU]/h | INTRAVENOUS | Status: AC

## 2019-10-13 MED ORDER — PHENYLEPHRINE 40 MCG/ML (10ML) SYRINGE FOR IV PUSH (FOR BLOOD PRESSURE SUPPORT)
PREFILLED_SYRINGE | INTRAVENOUS | Status: DC | PRN
  Administered 2019-10-13 (×6): 40 ug via INTRAVENOUS

## 2019-10-13 MED ORDER — DIPHENHYDRAMINE HCL 25 MG PO CAPS
25.0000 mg | ORAL_CAPSULE | Freq: Four times a day (QID) | ORAL | Status: DC | PRN
  Administered 2019-10-14: 25 mg via ORAL
  Filled 2019-10-13: qty 1

## 2019-10-13 MED ORDER — MENTHOL 3 MG MT LOZG: 1.0000 | LOZENGE | OROMUCOSAL | Status: DC | PRN

## 2019-10-13 MED ORDER — STERILE WATER FOR IRRIGATION IR SOLN
Status: DC | PRN
  Administered 2019-10-13: 1000 mL

## 2019-10-13 MED ORDER — FENTANYL CITRATE (PF) 100 MCG/2ML IJ SOLN: 25.0000 ug | INTRAMUSCULAR | Status: DC | PRN

## 2019-10-13 MED ORDER — KETOROLAC TROMETHAMINE 30 MG/ML IJ SOLN
INTRAMUSCULAR | Status: AC
  Filled 2019-10-13: qty 1

## 2019-10-13 MED ORDER — METHYLERGONOVINE MALEATE 0.2 MG/ML IJ SOLN: 0.2000 mg | INTRAMUSCULAR | Status: DC | PRN

## 2019-10-13 MED ORDER — ONDANSETRON HCL 4 MG/2ML IJ SOLN
INTRAMUSCULAR | Status: AC
  Filled 2019-10-13: qty 2

## 2019-10-13 MED ORDER — SOD CITRATE-CITRIC ACID 500-334 MG/5ML PO SOLN
ORAL | Status: AC
  Filled 2019-10-13: qty 30

## 2019-10-13 MED ORDER — FAMOTIDINE 20 MG PO TABS
20.0000 mg | ORAL_TABLET | Freq: Once | ORAL | Status: AC
  Administered 2019-10-13: 20 mg via ORAL

## 2019-10-13 MED ORDER — ACETAMINOPHEN 325 MG PO TABS
650.0000 mg | ORAL_TABLET | ORAL | Status: DC | PRN
  Administered 2019-10-13 – 2019-10-14 (×2): 650 mg via ORAL
  Filled 2019-10-13 (×2): qty 2

## 2019-10-13 MED ORDER — KETOROLAC TROMETHAMINE 30 MG/ML IJ SOLN: 30.0000 mg | Freq: Four times a day (QID) | INTRAMUSCULAR | Status: AC | PRN

## 2019-10-13 MED ORDER — OXYCODONE HCL 5 MG/5ML PO SOLN: 5.0000 mg | Freq: Once | ORAL | Status: DC | PRN

## 2019-10-13 MED ORDER — DIBUCAINE (PERIANAL) 1 % EX OINT: 1.0000 "application " | TOPICAL_OINTMENT | CUTANEOUS | Status: DC | PRN

## 2019-10-13 MED ORDER — SENNOSIDES-DOCUSATE SODIUM 8.6-50 MG PO TABS
2.0000 | ORAL_TABLET | ORAL | Status: DC
  Administered 2019-10-13 – 2019-10-14 (×2): 2 via ORAL
  Filled 2019-10-13 (×2): qty 2

## 2019-10-13 MED ORDER — FAMOTIDINE 20 MG PO TABS
ORAL_TABLET | ORAL | Status: AC
  Filled 2019-10-13: qty 1

## 2019-10-13 MED ORDER — PHENYLEPHRINE HCL-NACL 20-0.9 MG/250ML-% IV SOLN
INTRAVENOUS | Status: AC
  Filled 2019-10-13: qty 250

## 2019-10-13 MED ORDER — SOD CITRATE-CITRIC ACID 500-334 MG/5ML PO SOLN
30.0000 mL | Freq: Once | ORAL | Status: AC
  Administered 2019-10-13: 30 mL via ORAL

## 2019-10-13 MED ORDER — PHENYLEPHRINE 40 MCG/ML (10ML) SYRINGE FOR IV PUSH (FOR BLOOD PRESSURE SUPPORT)
PREFILLED_SYRINGE | INTRAVENOUS | Status: AC
  Filled 2019-10-13: qty 20

## 2019-10-13 MED ORDER — SCOPOLAMINE 1 MG/3DAYS TD PT72
MEDICATED_PATCH | TRANSDERMAL | Status: AC
  Filled 2019-10-13: qty 1

## 2019-10-13 MED ORDER — TETANUS-DIPHTH-ACELL PERTUSSIS 5-2.5-18.5 LF-MCG/0.5 IM SUSP: 0.5000 mL | Freq: Once | INTRAMUSCULAR | Status: DC

## 2019-10-13 MED ORDER — ZOLPIDEM TARTRATE 5 MG PO TABS: 5.0000 mg | ORAL_TABLET | Freq: Every evening | ORAL | Status: DC | PRN

## 2019-10-13 MED ORDER — WITCH HAZEL-GLYCERIN EX PADS: 1.0000 "application " | MEDICATED_PAD | CUTANEOUS | Status: DC | PRN

## 2019-10-13 MED ORDER — OXYCODONE HCL 5 MG PO TABS: 5.0000 mg | ORAL_TABLET | Freq: Once | ORAL | Status: DC | PRN

## 2019-10-13 MED ORDER — PRENATAL MULTIVITAMIN CH
1.0000 | ORAL_TABLET | Freq: Every day | ORAL | Status: DC
  Administered 2019-10-14 – 2019-10-15 (×2): 1 via ORAL
  Filled 2019-10-13 (×2): qty 1

## 2019-10-13 MED ORDER — MORPHINE SULFATE (PF) 0.5 MG/ML IJ SOLN
INTRAMUSCULAR | Status: DC | PRN
  Administered 2019-10-13: .15 mg via INTRATHECAL

## 2019-10-13 MED ORDER — METOCLOPRAMIDE HCL 5 MG/ML IJ SOLN
INTRAMUSCULAR | Status: AC
  Filled 2019-10-13: qty 2

## 2019-10-13 MED ORDER — METOCLOPRAMIDE HCL 5 MG/ML IJ SOLN
INTRAMUSCULAR | Status: DC | PRN
  Administered 2019-10-13: 10 mg via INTRAVENOUS

## 2019-10-13 MED ORDER — COCONUT OIL OIL: 1.0000 "application " | TOPICAL_OIL | Status: DC | PRN

## 2019-10-13 SURGICAL SUPPLY — 37 items
CHLORAPREP W/TINT 26ML (MISCELLANEOUS) ×3 IMPLANT
CLAMP CORD UMBIL (MISCELLANEOUS) IMPLANT
CLOTH BEACON ORANGE TIMEOUT ST (SAFETY) ×3 IMPLANT
DERMABOND ADHESIVE PROPEN (GAUZE/BANDAGES/DRESSINGS) ×2
DERMABOND ADVANCED (GAUZE/BANDAGES/DRESSINGS)
DERMABOND ADVANCED .7 DNX12 (GAUZE/BANDAGES/DRESSINGS) IMPLANT
DERMABOND ADVANCED .7 DNX6 (GAUZE/BANDAGES/DRESSINGS) IMPLANT
DRSG OPSITE POSTOP 4X10 (GAUZE/BANDAGES/DRESSINGS) ×3 IMPLANT
ELECT REM PT RETURN 9FT ADLT (ELECTROSURGICAL) ×3
ELECTRODE REM PT RTRN 9FT ADLT (ELECTROSURGICAL) ×1 IMPLANT
EXTRACTOR VACUUM M CUP 4 TUBE (SUCTIONS) IMPLANT
EXTRACTOR VACUUM M CUP 4' TUBE (SUCTIONS)
GLOVE BIO SURGEON STRL SZ7 (GLOVE) ×3 IMPLANT
GLOVE BIOGEL PI IND STRL 7.0 (GLOVE) ×1 IMPLANT
GLOVE BIOGEL PI INDICATOR 7.0 (GLOVE) ×2
GOWN STRL REUS W/TWL LRG LVL3 (GOWN DISPOSABLE) ×6 IMPLANT
KIT ABG SYR 3ML LUER SLIP (SYRINGE) IMPLANT
NDL HYPO 25X5/8 SAFETYGLIDE (NEEDLE) IMPLANT
NEEDLE HYPO 22GX1.5 SAFETY (NEEDLE) IMPLANT
NEEDLE HYPO 25X5/8 SAFETYGLIDE (NEEDLE) IMPLANT
NS IRRIG 1000ML POUR BTL (IV SOLUTION) ×3 IMPLANT
PACK C SECTION WH (CUSTOM PROCEDURE TRAY) ×3 IMPLANT
PAD OB MATERNITY 4.3X12.25 (PERSONAL CARE ITEMS) ×3 IMPLANT
PENCIL SMOKE EVAC W/HOLSTER (ELECTROSURGICAL) ×3 IMPLANT
RETRACTOR TRAXI PANNICULUS (MISCELLANEOUS) IMPLANT
RTRCTR C-SECT PINK 25CM LRG (MISCELLANEOUS) ×3 IMPLANT
SUT CHROMIC 1 CTX 36 (SUTURE) ×6 IMPLANT
SUT CHROMIC 2 0 CT 1 (SUTURE) ×3 IMPLANT
SUT PDS AB 0 CTX 60 (SUTURE) ×5 IMPLANT
SUT VIC AB 2-0 CT1 27 (SUTURE) ×2
SUT VIC AB 2-0 CT1 TAPERPNT 27 (SUTURE) ×1 IMPLANT
SUT VIC AB 4-0 KS 27 (SUTURE) IMPLANT
SYR 30ML LL (SYRINGE) IMPLANT
TOWEL OR 17X24 6PK STRL BLUE (TOWEL DISPOSABLE) ×3 IMPLANT
TRAXI PANNICULUS RETRACTOR (MISCELLANEOUS) ×2
TRAY FOLEY W/BAG SLVR 14FR LF (SET/KITS/TRAYS/PACK) ×3 IMPLANT
WATER STERILE IRR 1000ML POUR (IV SOLUTION) ×3 IMPLANT

## 2019-10-13 NOTE — Lactation Note (Signed)
This note was copied from a baby's chart. Lactation Consultation Note  Patient Name: Savannah Chavez [id number] Date: 10/13/2019   Spoke to RN Lorriane Shire prior entering the room and she told LC that mom has already BF baby and that she would rather be seen tomorrow, when [facility] offered to see her tonight. Baby is still very sleepy and she's aware of the first 24 hour newborn behavior, this is her third baby. Baby is FT and there are no other risk factors other than mom being COVID (+). RN Lorriane Shire to update flowsheet, lactation will be back tomorrow per mom's request.  Maternal Data    Feeding    LATCH Score                   Interventions    Lactation Tools Discussed/Used     Consult Status      Savannah Chavez 10/13/2019, 10:22 PM

## 2019-10-13 NOTE — H&P (Signed)
Savannah Chavez is a 33 y.o. female presenting for repeat cesarean section  33 yo G3P2002@39 +0 presents for repeat cesarean section. Her pregnancy has been uncomplicated except for covid Positive on 12/20. The patient's husband had tested positive the previous week. She briefly lost sense of taste and smell, however is no asymptomatic OB History    Gravida  3   Para  2   Term  2   Preterm  0   AB  0   Living  2     SAB  0   TAB  0   Ectopic  0   Multiple  0   Live Births  2          Past Medical History:  Diagnosis Date  . Complication of anesthesia   . H/O varicella   . Headache(784.0)    h/o migraines as a child  . Heartburn in pregnancy   . PONV (postoperative nausea and vomiting)    severe Nausea even with NPO with second CS   Past Surgical History:  Procedure Laterality Date  . CESAREAN SECTION  09/26/2012   Procedure: CESAREAN SECTION;  Surgeon: Farrel Gobble. Harrington Challenger, MD;  Location: [facility];  Service: Obstetrics;  Laterality: N/A;  . CESAREAN SECTION N/A 08/12/2013   Procedure: REPEAT CESAREAN SECTION with delivery of baby boy @ 42; Apgars 8/9;  Surgeon: Farrel Gobble. Harrington Challenger, MD;  Location: [facility];  Service: Obstetrics;  Laterality: N/A;  . NO PAST SURGERIES     Family History: family history is not on file. She was adopted. Social History:  reports that she has never smoked. She has never used smokeless tobacco. She reports that she does not drink alcohol or use drugs.     Maternal Diabetes: No Genetic Screening: Declined Maternal Ultrasounds/Referrals: Normal Fetal Ultrasounds or other Referrals:  None Maternal Substance Abuse:  No Significant Maternal Medications:  None Significant Maternal Lab Results:  None Other Comments:  None  Review of Systems History   Blood pressure 127/84, pulse 85, temperature 99.5 F (37.5 C), temperature source Oral, resp. rate 18, height 5\' 1"  (1.549 m), weight 97.1 kg, last menstrual period 01/13/2019, SpO2 100 %, currently  breastfeeding. Exam Physical Exam  Prenatal labs: ABO, Rh: --/--/O POS Performed at [facility], [place] [address] [place], [place] [address] , O POS [phone]) Antibody: NEG (12/20 0906) Rubella: Immune (06/12 0000) RPR: NON REACTIVE (12/20 0906)  HBsAg: Negative (06/12 0000)  HIV: Non-reactive (06/12 0000)  GBS: Negative/-- (12/03 0000)   Assessment/Plan: 1) admit 2) ANCEF OCTOR 3) SCDs   [name] 10/13/2019, 3:02 PM

## 2019-10-13 NOTE — Anesthesia Procedure Notes (Signed)
Spinal  Patient location during procedure: OR Start time: 10/13/2019 3:58 PM End time: 10/13/2019 4:02 PM Staffing Performed: anesthesiologist  Anesthesiologist: Audry Pili, MD Preanesthetic Checklist Completed: patient identified, IV checked, risks and benefits discussed, surgical consent, monitors and equipment checked, pre-op evaluation and timeout performed Spinal Block Patient position: sitting Prep: DuraPrep Patient monitoring: heart rate, cardiac monitor, continuous pulse ox and blood pressure Approach: midline Location: L2-3 Injection technique: single-shot Needle Needle type: Pencan  Needle gauge: 24 G Additional Notes Consent was obtained prior to the procedure with all questions answered and concerns addressed. Risks including, but not limited to, bleeding, infection, nerve damage, paralysis, failed block, inadequate analgesia, allergic reaction, high spinal, itching, and headache were discussed and the patient wished to proceed. Functioning IV was confirmed and monitors were applied. Sterile prep and drape, including hand hygiene, mask, and sterile gloves were used. The patient was positioned and the spine was prepped. The skin was anesthetized with lidocaine. Free flow of clear CSF was obtained prior to injecting local anesthetic into the CSF. The spinal needle aspirated freely following injection. The needle was carefully withdrawn. The patient tolerated the procedure well.   Renold Don, MD

## 2019-10-13 NOTE — Anesthesia Preprocedure Evaluation (Addendum)
Anesthesia Evaluation  Patient identified by MRN, date of birth, ID band Patient awake    Reviewed: Allergy & Precautions, NPO status , Patient's Chart, lab work & pertinent test results  History of Anesthesia Complications (+) PONV and history of anesthetic complications  Airway Mallampati: II  TM Distance: >3 FB Neck ROM: Full    Dental  (+) Dental Advisory Given   Pulmonary neg pulmonary ROS,    Pulmonary exam normal        Cardiovascular negative cardio ROS Normal cardiovascular exam     Neuro/Psych  Headaches, negative psych ROS   GI/Hepatic Neg liver ROS, GERD  Controlled,  Endo/Other  Morbid obesity  Renal/GU negative Renal ROS     Musculoskeletal negative musculoskeletal ROS (+)   Abdominal (+) + obese,   Peds  Hematology  (+) anemia ,  Plt 212k    Anesthesia Other Findings Covid positive 12/20  Reproductive/Obstetrics (+) Pregnancy                            Anesthesia Physical Anesthesia Plan  ASA: III  Anesthesia Plan: Spinal   Post-op Pain Management:    Induction:   PONV Risk Score and Plan: 3 and Treatment may vary due to age or medical condition, Ondansetron, Scopolamine patch - Pre-op and Dexamethasone  Airway Management Planned: Natural Airway  Additional Equipment: None  Intra-op Plan:   Post-operative Plan:   Informed Consent: I have reviewed the patients History and Physical, chart, labs and discussed the procedure including the risks, benefits and alternatives for the proposed anesthesia with the patient or authorized representative who has indicated his/her understanding and acceptance.       Plan Discussed with: CRNA and Anesthesiologist  Anesthesia Plan Comments: (Labs reviewed, platelets acceptable. Discussed risks and benefits of spinal, including spinal/epidural hematoma, infection, failed block, and PDPH. Patient expressed understanding and  wished to proceed. )       Anesthesia Quick Evaluation

## 2019-10-13 NOTE — Transfer of Care (Signed)
Immediate Anesthesia Transfer of Care Note  Patient: Savannah Chavez  Procedure(s) Performed: CESAREAN SECTION (N/A Abdomen)  Patient Location: PACU  Anesthesia Type:Spinal  Level of Consciousness: awake  Airway & Oxygen Therapy: Patient Spontanous Breathing  Post-op Assessment: Report given to RN and Post -op Vital signs reviewed and stable  Post vital signs: Reviewed and stable  Last Vitals:  Vitals Value Taken Time  BP 120/80 10/13/19 1715  Temp 37 C 10/13/19 1715  Pulse 98 10/13/19 1729  Resp 24 10/13/19 1729  SpO2 99 % 10/13/19 1729  Vitals shown include unvalidated device data.  Last Pain:  Vitals:   10/13/19 1715  TempSrc: Oral  PainSc: 0-No pain      Patients Stated Pain Goal: 4 (33/83/29 1916)  Complications: No apparent anesthesia complications

## 2019-10-13 NOTE — Op Note (Addendum)
Pre-Operative Diagnosis: 1) 39+0-week intrauterine pregnancy 2) history of prior cesarean section x2, not a candidate for trial of labor Postoperative Diagnosis: 1) 39+0-week intrauterine pregnancy 2) history of prior cesarean section x2, not a candidate for trial of labor Procedure: Repeat low transverse cesarean section Surgeon: Dr. [name] Assistant: None Operative Findings: Vigorous female infant in the vertex presentation with Apgar scores pending.  Weight pending.  Normal-appearing ovaries and tubes.  The lower uterine segment was extremely thin, however no distinct window was identified Specimen: Placenta for disposal EBL: Total I/O In: 2300 [I.V.:2300] Out: 192 [Urine:50; Blood:142]   Procedure:[name] is an 33 year old gravida 3 para 2002 at 32 weeks and 0 days estimated gestational age who presents for cesarean section.  Due to the Covid positive status of the patient, the appropriate PPE and procedures were followed.  Following the appropriate informed consent the patient was brought to the operating room where spinal anesthesia was administered and found to be adequate. She was placed in the dorsal supine position with a leftward tilt. She was prepped and draped in the normal sterile fashion.  The patient was appropriately identified during a preoperative timeout procedure.  The scalpel was then used to make a Pfannenstiel skin incision which was carried down to the underlying layers of soft tissue to the fascia. The fascia was incised in the midline and the fascial incision was extended laterally with Mayo scissors. The superior aspect of the fascial incision was grasped with Coker clamps x2, tented up and the rectus muscles dissected off sharply with the electrocautery unit area and the same procedure was repeated on the inferior aspect of the fascial incision. The rectus muscles were separated in the midline. The abdominal peritoneum was identified, tented up, entered sharply, and  the incision was extended superiorly and inferiorly with good visualization of the bladder. The Alexis retractor was then deployed. The vesicouterine peritoneum was identified, tented up, entered sharply, and the bladder flap was created digitally. Scalpel was then used to make a low transverse incision on the uterus which was extended laterally with blunt dissection. The fetal vertex was identified, delivered easily through the uterine incision followed by the body. The infant was bulb suctioned on the operative field cried vigorously.  After a 1 minute delay, the cord was clamped and cut and the infant was passed to the waiting neonatology team. Placenta was then delivered spontaneously, the uterus was cleared of all clot and debris. The uterine incision was repaired with #1 chromic in running locked fashion followed by a second imbricating layer. Ovaries and tubes were inspected and normal. The Alexis retractor was removed. The abdominal peritoneum was reapproximated with 2-0 Vicryl in a running fashion, the rectus muscles was reapproximated with #1 chromic in a running fashion. The fascia was closed with 0 looped PDS in a running fashion. The skin was closed with 4-0 vicryl in a subcuticular fashion and Dermabond. All sponge lap and needle counts were correct. Patient tolerated the procedure well and recovered in stable condition following the procedure.

## 2019-10-14 ENCOUNTER — Encounter (HOSPITAL_COMMUNITY): Payer: Self-pay | Admitting: Obstetrics and Gynecology

## 2019-10-14 LAB — CBC
HCT: 29.5 % — ABNORMAL LOW (ref 36.0–46.0)
Hemoglobin: 9.9 g/dL — ABNORMAL LOW (ref 12.0–15.0)
MCH: 30.5 pg (ref 26.0–34.0)
MCHC: 33.6 g/dL (ref 30.0–36.0)
MCV: 90.8 fL (ref 80.0–100.0)
Platelets: 183 10*3/uL (ref 150–400)
RBC: 3.25 MIL/uL — ABNORMAL LOW (ref 3.87–5.11)
RDW: 13.4 % (ref 11.5–15.5)
WBC: 9.1 10*3/uL (ref 4.0–10.5)
nRBC: 0 % (ref 0.0–0.2)

## 2019-10-14 MED ORDER — SCOPOLAMINE 1 MG/3DAYS TD PT72: 1.0000 | MEDICATED_PATCH | Freq: Once | TRANSDERMAL | Status: DC

## 2019-10-14 MED ORDER — DIPHENHYDRAMINE HCL 50 MG/ML IJ SOLN: 12.5000 mg | INTRAMUSCULAR | Status: DC | PRN

## 2019-10-14 MED ORDER — NALBUPHINE HCL 10 MG/ML IJ SOLN: 5.0000 mg | Freq: Once | INTRAMUSCULAR | Status: DC | PRN

## 2019-10-14 MED ORDER — NALBUPHINE HCL 10 MG/ML IJ SOLN: 5.0000 mg | INTRAMUSCULAR | Status: DC | PRN

## 2019-10-14 MED ORDER — DIPHENHYDRAMINE HCL 25 MG PO CAPS: 25.0000 mg | ORAL_CAPSULE | ORAL | Status: DC | PRN

## 2019-10-14 MED ORDER — NALOXONE HCL 0.4 MG/ML IJ SOLN: 0.4000 mg | INTRAMUSCULAR | Status: DC | PRN

## 2019-10-14 MED ORDER — OXYCODONE-ACETAMINOPHEN 5-325 MG PO TABS
1.0000 | ORAL_TABLET | ORAL | Status: DC | PRN
  Administered 2019-10-14: 1 via ORAL
  Administered 2019-10-14: 2 via ORAL
  Administered 2019-10-15 (×2): 1 via ORAL
  Filled 2019-10-14: qty 1
  Filled 2019-10-14: qty 2
  Filled 2019-10-14 (×2): qty 1

## 2019-10-14 MED ORDER — ONDANSETRON HCL 4 MG/2ML IJ SOLN: 4.0000 mg | Freq: Three times a day (TID) | INTRAMUSCULAR | Status: DC | PRN

## 2019-10-14 MED ORDER — SODIUM CHLORIDE 0.9% FLUSH: 3.0000 mL | INTRAVENOUS | Status: DC | PRN

## 2019-10-14 MED ORDER — NALOXONE HCL 4 MG/10ML IJ SOLN
1.0000 ug/kg/h | INTRAVENOUS | Status: DC | PRN
  Filled 2019-10-14: qty 5

## 2019-10-14 NOTE — Lactation Note (Signed)
This note was copied from a baby's chart. Lactation Consultation Note  Patient Name: Savannah Chavez [id number] Date: 10/14/2019 Reason for consult: Initial assessment;Term P3.  Mom breastfed her previous babies for 1-3 months.  She states one baby had stomach issues possibly from her diet and she lost her milk supply at 3 months.  Newborn is 68 hours old and mom reports infant is feeding well.  Baby is currently sleeping in crib.  Instructed to feed with any cue and call for assist prn.  Discussed cluster feeding on days 2-3.  Breastfeeding consultation services information given and reviewed.  No questions at this time.  Maternal Data Does the patient have breastfeeding experience prior to this delivery?: Yes  Feeding Feeding Type: Breast Fed  LATCH Score                   Interventions    Lactation Tools Discussed/Used     Consult Status Consult Status: Follow-up Date: 10/15/19 Follow-up type: In-patient    Ave Filter 10/14/2019, 11:29 AM

## 2019-10-14 NOTE — Anesthesia Postprocedure Evaluation (Signed)
Anesthesia Post Note  Patient: Savannah Chavez  Procedure(s) Performed: CESAREAN SECTION (N/A Abdomen)     Patient location during evaluation: PACU Anesthesia Type: Spinal Level of consciousness: awake and alert Pain management: pain level controlled Vital Signs Assessment: post-procedure vital signs reviewed and stable Respiratory status: spontaneous breathing and respiratory function stable Cardiovascular status: blood pressure returned to baseline and stable Postop Assessment: spinal receding and no apparent nausea or vomiting Anesthetic complications: no    Last Vitals:  Vitals:   10/14/19 0200 10/14/19 0602  BP: (!) 109/59 109/66  Pulse: 70 75  Resp: 18 16  Temp: 36.7 C 37.1 C  SpO2: 98% 100%    Last Pain:  Vitals:   10/14/19 0602  TempSrc: Oral  PainSc:                  Audry Pili

## 2019-10-15 MED ORDER — OXYCODONE HCL 5 MG PO TABS: 5.0000 mg | ORAL_TABLET | Freq: Four times a day (QID) | ORAL | 0 refills | Status: DC | PRN

## 2019-10-15 MED ORDER — IBUPROFEN 800 MG PO TABS: 800.0000 mg | ORAL_TABLET | Freq: Four times a day (QID) | ORAL | 0 refills | Status: DC

## 2019-10-15 NOTE — Discharge Summary (Signed)
Obstetric Discharge Summary Reason for Admission: cesarean section Prenatal Procedures: none Intrapartum Procedures: cesarean: low cervical, transverse Postpartum Procedures: none Complications-Operative and Postpartum: asymptomatic covid positive on admission Hemoglobin  Date Value Ref Range Status  10/14/2019 9.9 (L) 12.0 - 15.0 g/dL Final   HCT  Date Value Ref Range Status  10/14/2019 29.5 (L) 36.0 - 46.0 % Final    Physical Exam:  General: alert, cooperative and appears stated age 33: appropriate Uterine Fundus: firm Incision: healing well, no significant drainage DVT Evaluation: No evidence of DVT seen on physical exam.  Discharge Diagnoses: Term Pregnancy-delivered  Discharge Information: Date: 10/15/2019 Activity: pelvic rest Diet: routine Medications: PNV, Ibuprofen and oxycodone Condition: stable Instructions: refer to practice specific booklet Discharge to: home Follow-up Information    [name], MD Follow up in 4 week(s).   Specialty: Obstetrics and Gynecology Why: postpartum visit Contact information: [place] [address] [phone]           Newborn Data: Live born female  Birth Weight: 7 lb 1.8 oz (3225 g) APGAR: 67, 9  Newborn Delivery   Birth date/time: 10/13/2019 16:25:00 Delivery type: C-Section, Low Transverse Trial of labor: No C-section categorization: Repeat      Home with mother.  [place] 10/15/2019, 8:59 AM

## 2019-10-15 NOTE — Lactation Note (Signed)
This note was copied from a baby's chart. Lactation Consultation Note  Patient Name: Girl Dreya Alcaraz [id number] Date: 10/15/2019   Mom is a P3. Via telephone call. Mom reports being d/c today.  Mom is Covid 19 positive and they do not have a follow up appt until Monday. Infant with 7 percent weight loss now 61 hours old. Mom has not been pumping or hand expressing here.  Mom has a breastpump for home use.  Urged mom to pump and hand express past 5-6 breastfeeds when they get home and feed back to infant via cup or spoon.  Mom in agreement. Urged mom to definitely do during the day even if she is not able to pump at night.  Mom reports she feels she is breastfeeding well and often.  Mom reports nipple soreness but reports it isnt pain just soreness.Urged to feed on cue and 8-12 times day.  Mom has breastfeeding follow up resources for home use.  Urged her to call lactation as needed.  Maternal Data    Feeding Feeding Type: Breast Fed  LATCH Score Latch: Repeated attempts needed to sustain latch, nipple held in mouth throughout feeding, stimulation needed to elicit sucking reflex.  Audible Swallowing: A few with stimulation  Type of Nipple: Everted at rest and after stimulation  Comfort (Breast/Nipple): Filling, red/small blisters or bruises, mild/mod discomfort  Hold (Positioning): No assistance needed to correctly position infant at breast.  LATCH Score: 7  Interventions    Lactation Tools Discussed/Used     Consult Status      Melanie Openshaw Thompson Caul 10/15/2019, 11:22 AM

## 2019-10-15 NOTE — Progress Notes (Signed)
Patient is doing well.  She is tolerating PO, ambulating, voiding.  Pain is controlled.  Lochia is appropriate  Vitals:   10/14/19 0602 10/14/19 1411 10/14/19 2302 10/15/19 0522  BP: 109/66 109/64 118/76 117/80  Pulse: 75 72 77 83  Resp: 16 17 18 16   Temp: 98.7 F (37.1 C) 98.4 F (36.9 C) 98.4 F (36.9 C) 98.4 F (36.9 C)  TempSrc: Oral Oral  Oral  SpO2: 100%   100%  Weight:      Height:        NAD Abdomen:  soft, appropriate tenderness, incisions intact and without erythema or drainage Mild erythema adjacent to superior left edge of honeycomb--suspect contact irritation ext:    Symmetric, 1+ edema bilaterally  Lab Results  Component Value Date   WBC 9.1 10/14/2019   HGB 9.9 (L) 10/14/2019   HCT 29.5 (L) 10/14/2019   MCV 90.8 10/14/2019   PLT 183 10/14/2019    --/--/O POS Performed at [facility] [address], [place], [place] [address] , O POS (12/20 0906)/[employer]  A/P    33 y.o. G3P3003 POD 2 s/p RCS Routine post op and postpartum care.   Remove honeycomb w shower Meeting all goals--discharge to home today

## 2019-11-06 ENCOUNTER — Ambulatory Visit (HOSPITAL_COMMUNITY)
Admission: RE | Admit: 2019-11-06 | Discharge: 2019-11-06 | Disposition: A | Discharge status: Home / Self Care | Payer: Commercial Managed Care - PPO | Source: Ambulatory Visit | Attending: Obstetrics | Admitting: Obstetrics

## 2019-11-06 ENCOUNTER — Encounter (INDEPENDENT_AMBULATORY_CARE_PROVIDER_SITE_OTHER): Payer: Self-pay

## 2019-11-06 ENCOUNTER — Other Ambulatory Visit: Payer: Self-pay

## 2019-11-06 ENCOUNTER — Other Ambulatory Visit (HOSPITAL_COMMUNITY): Payer: Self-pay | Admitting: Obstetrics

## 2019-11-06 DIAGNOSIS — M79605 Pain in left leg: Secondary | ICD-10-CM | POA: Diagnosis present

## 2019-11-06 DIAGNOSIS — M7989 Other specified soft tissue disorders: Secondary | ICD-10-CM

## 2019-11-06 NOTE — Progress Notes (Signed)
Left lower extremity venous duplex completed. Refer to "CV Proc" under chart review to view preliminary results.  11/06/2019 11:47 AM Eula Fried., MHA, RVT, RDCS, RDMS

## 2021-05-24 ENCOUNTER — Ambulatory Visit: Payer: Self-pay | Admitting: Cardiology

## 2021-06-13 ENCOUNTER — Ambulatory Visit: Payer: Self-pay | Admitting: Cardiology

## 2021-06-20 ENCOUNTER — Ambulatory Visit: Payer: Self-pay | Admitting: Cardiology

## 2021-06-20 ENCOUNTER — Ambulatory Visit
Admission: RE | Admit: 2021-06-20 | Discharge: 2021-06-20 | Disposition: A | Discharge status: Home / Self Care | Payer: Commercial Managed Care - PPO | Source: Ambulatory Visit | Attending: Family Medicine | Admitting: Family Medicine

## 2021-06-20 ENCOUNTER — Other Ambulatory Visit: Payer: Self-pay | Admitting: Family Medicine

## 2021-06-20 ENCOUNTER — Other Ambulatory Visit: Payer: Self-pay

## 2021-06-20 DIAGNOSIS — M533 Sacrococcygeal disorders, not elsewhere classified: Secondary | ICD-10-CM

## 2022-10-16 ENCOUNTER — Encounter: Payer: Self-pay | Admitting: *Deleted

## 2022-12-11 NOTE — Progress Notes (Deleted)
Cardiology Office Note:    Date:  12/11/2022   ID:  Savannah Chavez, DOB [birth year], MRN [id number]  PCP:  Patient, No Pcp Per   [facility] Providers Cardiologist:  None { Click to update primary MD,subspecialty MD or APP then REFRESH:1}    Referring MD: [name], MD   No chief complaint on file. ***  History of Present Illness:    Savannah Chavez is a 37 y.o. female is seen at the request of Dr Harrington Challenger for evaluation of chest pain.   Past Medical History:  Diagnosis Date   Complication of anesthesia    H/O varicella    Headache(784.0)    h/o migraines as a child   Heartburn in pregnancy    PONV (postoperative nausea and vomiting)    severe Nausea even with NPO with second CS    Past Surgical History:  Procedure Laterality Date   CESAREAN SECTION  09/26/2012   Procedure: CESAREAN SECTION;  Surgeon: Farrel Gobble. Harrington Challenger, MD;  Location: [facility];  Service: Obstetrics;  Laterality: N/A;   CESAREAN SECTION N/A 08/12/2013   Procedure: REPEAT CESAREAN SECTION with delivery of baby boy @ 52; Apgars 8/9;  Surgeon: Farrel Gobble. Harrington Challenger, MD;  Location: [facility];  Service: Obstetrics;  Laterality: N/A;   CESAREAN SECTION N/A 10/13/2019   Procedure: CESAREAN SECTION;  Surgeon: [name], MD;  Location: [facility];  Service: Obstetrics;  Laterality: N/A;   NO PAST SURGERIES      Current Medications: No outpatient medications have been marked as taking for the 12/18/22 encounter (Appointment) with [place], Rjay Revolorio M, MD.     Allergies:   Penicillins   Social History   Socioeconomic History   Marital status: Married    Spouse name: Not on file   Number of children: Not on file   Years of education: Not on file   Highest education level: Not on file  Occupational History   Not on file  Tobacco Use   Smoking status: Never   Smokeless tobacco: Never  Vaping Use   Vaping Use: Never used  Substance and Sexual Activity   Alcohol use: No   Drug use: No   Sexual activity: Yes   Other Topics Concern   Not on file  Social History Narrative   Not on file   Social Determinants of Health   Financial Resource Strain: Not on file  Food Insecurity: Not on file  Transportation Needs: Not on file  Physical Activity: Not on file  Stress: Not on file  Social Connections: Not on file     Family History: The patient's ***family history is not on file. She was adopted.  ROS:   Please see the history of present illness.    *** All other systems reviewed and are negative.  EKGs/Labs/Other Studies Reviewed:    The following studies were reviewed today: ***  EKG:  EKG is *** ordered today.  The ekg ordered today demonstrates ***  Recent Labs: No results found for requested labs within last 365 days.  Recent Lipid Panel No results found for: "CHOL", "TRIG", "HDL", "CHOLHDL", "VLDL", "LDLCALC", "LDLDIRECT"   Risk Assessment/Calculations:   {Does this patient have ATRIAL FIBRILLATION?:[id number]}  No BP recorded.  {Refresh Note OR Click here to enter BP  :1}***         Physical Exam:    VS:  There were no vitals taken for this visit.    Wt Readings from Last 3 Encounters:  10/13/19 214  lb (97.1 kg)  10/02/19 214 lb (97.1 kg)  08/12/13 210 lb (95.3 kg)     GEN: *** Well nourished, well developed in no acute distress HEENT: Normal NECK: No JVD; No carotid bruits LYMPHATICS: No lymphadenopathy CARDIAC: ***RRR, no murmurs, rubs, gallops RESPIRATORY:  Clear to auscultation without rales, wheezing or rhonchi  ABDOMEN: Soft, non-tender, non-distended MUSCULOSKELETAL:  No edema; No deformity  SKIN: Warm and dry NEUROLOGIC:  Alert and oriented x 3 PSYCHIATRIC:  Normal affect   ASSESSMENT:    No diagnosis found. PLAN:    In order of problems listed above:  ***      {Are you ordering a CV Procedure (e.g. stress test, cath, DCCV, TEE, etc)?   Press F2        :[id number]    Medication Adjustments/Labs and Tests Ordered: Current medicines are  reviewed at length with the patient today.  Concerns regarding medicines are outlined above.  No orders of the defined types were placed in this encounter.  No orders of the defined types were placed in this encounter.   There are no Patient Instructions on file for this visit.   Signed, Mabel Roll [place], MD  12/11/2022 2:07 PM    [place]

## 2022-12-18 ENCOUNTER — Ambulatory Visit: Payer: Commercial Managed Care - PPO | Admitting: Cardiology

## 2023-01-09 ENCOUNTER — Ambulatory Visit: Payer: Commercial Managed Care - PPO | Attending: Cardiology | Admitting: Cardiovascular Disease

## 2023-01-09 ENCOUNTER — Encounter: Payer: Self-pay | Admitting: Cardiovascular Disease

## 2023-01-09 VITALS — BP 110/72 | HR 83 | Ht 61.0 in | Wt 212.0 lb

## 2023-01-09 DIAGNOSIS — E782 Mixed hyperlipidemia: Secondary | ICD-10-CM

## 2023-01-09 DIAGNOSIS — R0789 Other chest pain: Secondary | ICD-10-CM | POA: Diagnosis not present

## 2023-01-09 DIAGNOSIS — E785 Hyperlipidemia, unspecified: Secondary | ICD-10-CM | POA: Insufficient documentation

## 2023-01-09 NOTE — Assessment & Plan Note (Signed)
History of atypical chest pressure beginning about 4 years ago occurring on a weekly basis lasting hours at a time.  She has no cardiac risk factors.  This does not sound ischemically mediated.  I am going to get a 2D echo and a coronary calcium score to further evaluate.

## 2023-01-09 NOTE — Progress Notes (Signed)
01/09/2023 Savannah Chavez   03-19-1986  [id number]  Primary Physician Patient, No Pcp Per Primary Cardiologist: Lorretta Harp MD Garret Reddish, [place], [place]  HPI:  Savannah Chavez is a 37 y.o. morbidly overweight married Caucasian female mother of 3 children.  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  She was referred by Dr. [name] for cardiovascular dilation because of atypical chest pressure.  She basically has no cardiac risk factors.  She does not know her family history since she was adopted.  She does admit to being an anxious person.  She has GERD on H2 blockers.  She is never had a heart attack or stroke.  This pain for years ago.  The chest pressure occurs once a week and lasts hours at a time.  It does not have anginal qualities.   Current Meds  Medication Sig   omeprazole (PRILOSEC) 20 MG capsule Take 20 mg by mouth daily.     Allergies  Allergen Reactions   Penicillins Hives    Did it involve swelling of the face/tongue/throat, SOB, or low BP? No Did it involve sudden or severe rash/hives, skin peeling, or any reaction on the inside of your mouth or nose? Yes Did you need to seek medical attention at a hospital or doctor's office? Yes When did it last happen?      childhood allergy If all above answers are "NO", may proceed with cephalosporin use.     Social History   Socioeconomic History   Marital status: Married    Spouse name: Not on file   Number of children: Not on file   Years of education: Not on file   Highest education level: Not on file  Occupational History   Not on file  Tobacco Use   Smoking status: Never   Smokeless tobacco: Never  Vaping Use   Vaping Use: Never used  Substance and Sexual Activity   Alcohol use: No   Drug use: No   Sexual activity: Yes  Other Topics Concern   Not on file  Social History Narrative   Not on file   Social Determinants of Health   Financial Resource Strain: Not on file  Food Insecurity: Not on file   Transportation Needs: Not on file  Physical Activity: Not on file  Stress: Not on file  Social Connections: Not on file  Intimate Partner Violence: Not on file     Review of Systems: General: negative for chills, fever, night sweats or weight changes.  Cardiovascular: negative for chest pain, dyspnea on exertion, edema, orthopnea, palpitations, paroxysmal nocturnal dyspnea or shortness of breath Dermatological: negative for rash Respiratory: negative for cough or wheezing Urologic: negative for hematuria Abdominal: negative for nausea, vomiting, diarrhea, bright red blood per rectum, melena, or hematemesis Neurologic: negative for visual changes, syncope, or dizziness All other systems reviewed and are otherwise negative except as noted above.    Blood pressure 110/72, pulse 83, height 5\' 1"  (1.549 m), weight 212 lb (96.2 kg), unknown if currently breastfeeding.  General appearance: alert and no distress Neck: no adenopathy, no carotid bruit, no JVD, supple, symmetrical, trachea midline, and thyroid not enlarged, symmetric, no tenderness/mass/nodules Lungs: clear to auscultation bilaterally Heart: regular rate and rhythm, S1, S2 normal, no murmur, click, rub or gallop Extremities: extremities normal, atraumatic, no cyanosis or edema Pulses: 2+ and symmetric Skin: Skin color, texture, turgor normal. No rashes or lesions Neurologic: Grossly normal  EKG sinus rhythm 83 with poor R wave progression.  I  personally reviewed this EKG.  ASSESSMENT AND PLAN:   Atypical chest pain History of atypical chest pressure beginning about 4 years ago occurring on a weekly basis lasting hours at a time.  She has no cardiac risk factors.  This does not sound ischemically mediated.  I am going to get a 2D echo and a coronary calcium score to further evaluate.  Hyperlipidemia Mild hyperlipidemia with lipid profile performed 06/04/2022 revealing total cholesterol 166, LDL of 115 and HDL  43.     Lorretta Harp MD FACP,FACC,FAHA, [facility] 01/09/2023 11:44 AM

## 2023-01-09 NOTE — Patient Instructions (Signed)
Medication Instructions:  Your physician recommends that you continue on your current medications as directed. Please refer to the Current Medication list given to you today.  *If you need a refill on your cardiac medications before your next appointment, please call your pharmacy*   Testing/Procedures: Your physician has requested that you have an echocardiogram. Echocardiography is a painless test that uses sound waves to create images of your heart. It provides your doctor with information about the size and shape of your heart and how well your heart's chambers and valves are working. This procedure takes approximately one hour. There are no restrictions for this procedure. Please do NOT wear cologne, perfume, aftershave, or lotions (deodorant is allowed). Please arrive 15 minutes prior to your appointment time. This procedure will be done at [address] [place]   Dr. Gwenlyn Found has ordered a CT coronary calcium score.   Test locations:  [place]   This is $99 out of pocket.   Coronary CalciumScan A coronary calcium scan is an imaging test used to look for deposits of calcium and other fatty materials (plaques) in the inner lining of the blood vessels of the heart (coronary arteries). These deposits of calcium and plaques can partly clog and narrow the coronary arteries without producing any symptoms or warning signs. This puts a person at risk for a heart attack. This test can detect these deposits before symptoms develop. Tell a health care provider about: Any allergies you have. All medicines you are taking, including vitamins, herbs, eye drops, creams, and over-the-counter medicines. Any problems you or family members have had with anesthetic medicines. Any blood disorders you have. Any surgeries you have had. Any medical conditions you have. Whether you are pregnant or may be pregnant. What are the risks? Generally, this is a safe procedure.  However, problems may occur, including: Harm to a pregnant woman and her unborn baby. This test involves the use of radiation. Radiation exposure can be dangerous to a pregnant woman and her unborn baby. If you are pregnant, you generally should not have this procedure done. Slight increase in the risk of cancer. This is because of the radiation involved in the test. What happens before the procedure? No preparation is needed for this procedure. What happens during the procedure? You will undress and remove any jewelry around your neck or chest. You will put on a hospital gown. Sticky electrodes will be placed on your chest. The electrodes will be connected to an electrocardiogram (ECG) machine to record a tracing of the electrical activity of your heart. A CT scanner will take pictures of your heart. During this time, you will be asked to lie still and hold your breath for 2-3 seconds while a picture of your heart is being taken. The procedure may vary among health care providers and hospitals. What happens after the procedure? You can get dressed. You can return to your normal activities. It is up to you to get the results of your test. Ask your health care provider, or the department that is doing the test, when your results will be ready. Summary A coronary calcium scan is an imaging test used to look for deposits of calcium and other fatty materials (plaques) in the inner lining of the blood vessels of the heart (coronary arteries). Generally, this is a safe procedure. Tell your health care provider if you are pregnant or may be pregnant. No preparation is needed for this procedure. A CT scanner will  take pictures of your heart. You can return to your normal activities after the scan is done. This information is not intended to replace advice given to you by your health care provider. Make sure you discuss any questions you have with your health care provider. Document Released: 04/05/2008  Document Revised: 08/27/2016 Document Reviewed: 08/27/2016 Elsevier Interactive Patient Education  2017 [place]: At [facility], you and your health needs are our priority.  As part of our continuing mission to provide you with exceptional heart care, we have created designated Provider Care Teams.  These Care Teams include your primary Cardiologist (physician) and Advanced Practice Providers (APPs -  Physician Assistants and Nurse Practitioners) who all work together to provide you with the care you need, when you need it.  We recommend signing up for the patient portal called "MyChart".  Sign up information is provided on this After Visit Summary.  MyChart is used to connect with patients for Virtual Visits (Telemedicine).  Patients are able to view lab/test results, encounter notes, upcoming appointments, etc.  Non-urgent messages can be sent to your provider as well.   To learn more about what you can do with MyChart, go to [email or web address].    Your next appointment:   We will see you on an as needed basis.  Provider:   Quay Burow, MD

## 2023-01-09 NOTE — Assessment & Plan Note (Signed)
Mild hyperlipidemia with lipid profile performed 06/04/2022 revealing total cholesterol 166, LDL of 115 and HDL 43.

## 2023-02-13 ENCOUNTER — Ambulatory Visit (HOSPITAL_COMMUNITY): Payer: Commercial Managed Care - PPO

## 2023-03-13 ENCOUNTER — Other Ambulatory Visit (HOSPITAL_COMMUNITY): Payer: Commercial Managed Care - PPO

## 2023-04-10 ENCOUNTER — Ambulatory Visit (HOSPITAL_COMMUNITY): Payer: Commercial Managed Care - PPO | Attending: Cardiology

## 2023-04-10 DIAGNOSIS — E782 Mixed hyperlipidemia: Secondary | ICD-10-CM | POA: Insufficient documentation

## 2023-04-10 DIAGNOSIS — R0789 Other chest pain: Secondary | ICD-10-CM | POA: Diagnosis present

## 2023-04-10 LAB — ECHOCARDIOGRAM COMPLETE
Area-P 1/2: 3.73 cm2
S' Lateral: 2.8 cm

## 2023-08-03 ENCOUNTER — Emergency Department (HOSPITAL_BASED_OUTPATIENT_CLINIC_OR_DEPARTMENT_OTHER)
Admission: EM | Admit: 2023-08-03 | Discharge: 2023-08-03 | Disposition: A | Discharge status: Home / Self Care | Payer: Commercial Managed Care - PPO | Attending: Emergency Medicine | Admitting: Emergency Medicine

## 2023-08-03 ENCOUNTER — Emergency Department (HOSPITAL_BASED_OUTPATIENT_CLINIC_OR_DEPARTMENT_OTHER): Payer: Commercial Managed Care - PPO

## 2023-08-03 ENCOUNTER — Encounter (HOSPITAL_BASED_OUTPATIENT_CLINIC_OR_DEPARTMENT_OTHER): Payer: Self-pay

## 2023-08-03 ENCOUNTER — Other Ambulatory Visit: Payer: Self-pay

## 2023-08-03 DIAGNOSIS — R110 Nausea: Secondary | ICD-10-CM | POA: Diagnosis not present

## 2023-08-03 DIAGNOSIS — R42 Dizziness and giddiness: Secondary | ICD-10-CM | POA: Insufficient documentation

## 2023-08-03 LAB — CBC WITH DIFFERENTIAL/PLATELET
Abs Immature Granulocytes: 0.02 10*3/uL (ref 0.00–0.07)
Basophils Absolute: 0 10*3/uL (ref 0.0–0.1)
Basophils Relative: 0 %
Eosinophils Absolute: 0 10*3/uL (ref 0.0–0.5)
Eosinophils Relative: 0 %
HCT: 44.7 % (ref 36.0–46.0)
Hemoglobin: 14.7 g/dL (ref 12.0–15.0)
Immature Granulocytes: 0 %
Lymphocytes Relative: 14 %
Lymphs Abs: 1.5 10*3/uL (ref 0.7–4.0)
MCH: 29.8 pg (ref 26.0–34.0)
MCHC: 32.9 g/dL (ref 30.0–36.0)
MCV: 90.7 fL (ref 80.0–100.0)
Monocytes Absolute: 0.4 10*3/uL (ref 0.1–1.0)
Monocytes Relative: 4 %
Neutro Abs: 8.5 10*3/uL — ABNORMAL HIGH (ref 1.7–7.7)
Neutrophils Relative %: 82 %
Platelets: 246 10*3/uL (ref 150–400)
RBC: 4.93 MIL/uL (ref 3.87–5.11)
RDW: 13.1 % (ref 11.5–15.5)
WBC: 10.5 10*3/uL (ref 4.0–10.5)
nRBC: 0 % (ref 0.0–0.2)

## 2023-08-03 LAB — URINALYSIS, ROUTINE W REFLEX MICROSCOPIC
Bilirubin Urine: NEGATIVE
Glucose, UA: NEGATIVE mg/dL
Hgb urine dipstick: NEGATIVE
Ketones, ur: NEGATIVE mg/dL
Leukocytes,Ua: NEGATIVE
Nitrite: NEGATIVE
Protein, ur: NEGATIVE mg/dL
Specific Gravity, Urine: 1.013 (ref 1.005–1.030)
pH: 6 (ref 5.0–8.0)

## 2023-08-03 LAB — BASIC METABOLIC PANEL
Anion gap: 7 (ref 5–15)
BUN: 14 mg/dL (ref 6–20)
CO2: 28 mmol/L (ref 22–32)
Calcium: 9.5 mg/dL (ref 8.9–10.3)
Chloride: 104 mmol/L (ref 98–111)
Creatinine, Ser: 0.72 mg/dL (ref 0.44–1.00)
GFR, Estimated: >60 mL/min (ref >60)
Glucose, Bld: 100 mg/dL — ABNORMAL HIGH (ref 70–99)
Potassium: 4.2 mmol/L (ref 3.5–5.1)
Sodium: 139 mmol/L (ref 135–145)

## 2023-08-03 LAB — PREGNANCY, URINE: Preg Test, Ur: NEGATIVE

## 2023-08-03 MED ORDER — ONDANSETRON HCL 4 MG/2ML IJ SOLN
4.0000 mg | Freq: Once | INTRAMUSCULAR | Status: DC
  Filled 2023-08-03: qty 2

## 2023-08-03 MED ORDER — ONDANSETRON 4 MG PO TBDP: 4.0000 mg | ORAL_TABLET | Freq: Three times a day (TID) | ORAL | 0 refills | Status: AC | PRN

## 2023-08-03 MED ORDER — ONDANSETRON 4 MG PO TBDP
4.0000 mg | ORAL_TABLET | Freq: Once | ORAL | Status: AC
  Administered 2023-08-03: 4 mg via ORAL
  Filled 2023-08-03: qty 1

## 2023-08-03 NOTE — ED Triage Notes (Signed)
Patient presents with recurrent dizziness +nausea without emesis that started October of last year. Has been treated for fluid in ears. Ongoing appointments with PCP.

## 2023-08-03 NOTE — ED Notes (Signed)
Reviewed AVS/discharge instruction with patient. Time allotted for and all questions answered. Patient is agreeable for d/c and escorted to ed exit by staff.  

## 2023-08-03 NOTE — ED Provider Notes (Signed)
[place] EMERGENCY DEPARTMENT AT [facility] Provider Note   CSN: [id number] Arrival date & time: 08/03/23  0935     History  Chief Complaint  Patient presents with   Dizziness    Savannah Chavez is a 37 y.o. female.   Dizziness 37 year old female presenting for dizziness.  She states she has had ongoing intermittent dizziness for about a year.  Worst episode was last October and was very severe.  She seen her PCP multiple times.  She was noted to have some fluid on her ears a couple times which they thought could be contributing to her has not seen ENT for this.  Has not had any hearing loss or ear pain.  Her symptoms typically occur at least once a week.  She describes it as feeling somewhat lightheaded and dizzy.  She has not had any palpitations, chest pain, shortness of breath.  No vision changes, weakness, numbness.  Has history of migraines has not has headaches with this but wonders if she could be having vestibular migraine.  Symptoms usually last about an hour and then she does not feel well for a few days.  She had a symptoms on Thursday, yesterday and this morning while getting ready she had sudden onset dizziness.  It is since resolved and lasted about amount hour.  Associated with nausea, no vomiting.  Does not think she is pregnant as she has an IUD.  She is not had any vomiting, normal p.o. intake.  Currently has some nausea but is otherwise asymptomatic.     Home Medications Prior to Admission medications   Medication Sig Start Date End Date Taking? Authorizing Provider  ondansetron (ZOFRAN-ODT) 4 MG disintegrating tablet Take 1 tablet (4 mg total) by mouth every 8 (eight) hours as needed for nausea or vomiting. 08/03/23  Yes Laurence Spates, MD  omeprazole (PRILOSEC) 20 MG capsule Take 20 mg by mouth daily.    [provider]      Allergies    Penicillins    Review of Systems   Review of Systems  Neurological:  Positive for dizziness.   Review of systems completed and notable as per HPI.  ROS otherwise negative.   Physical Exam Updated Vital Signs BP 117/66 (BP Location: Right Arm)   Pulse 87   Temp 98.3 F (36.8 C) (Oral)   Resp 17   SpO2 95%  Physical Exam Vitals and nursing note reviewed.  Constitutional:      General: She is not in acute distress.    Appearance: She is well-developed.  HENT:     Head: Normocephalic and atraumatic.     Mouth/Throat:     Mouth: Mucous membranes are moist.     Pharynx: Oropharynx is clear.  Eyes:     Extraocular Movements: Extraocular movements intact.     Conjunctiva/sclera: Conjunctivae normal.     Pupils: Pupils are equal, round, and reactive to light.  Cardiovascular:     Rate and Rhythm: Normal rate and regular rhythm.     Pulses: Normal pulses.     Heart sounds: Normal heart sounds. No murmur heard. Pulmonary:     Effort: Pulmonary effort is normal. No respiratory distress.     Breath sounds: Normal breath sounds.  Abdominal:     Palpations: Abdomen is soft.     Tenderness: There is no abdominal tenderness.  Musculoskeletal:        General: No swelling.     Cervical back: Neck supple.  Right lower leg: No edema.     Left lower leg: No edema.  Skin:    General: Skin is warm and dry.     Capillary Refill: Capillary refill takes less than 2 seconds.  Neurological:     General: No focal deficit present.     Mental Status: She is alert and oriented to person, place, and time. Mental status is at baseline.     Cranial Nerves: No cranial nerve deficit.     Sensory: No sensory deficit.     Motor: No weakness.     Coordination: Coordination normal.     Gait: Gait normal.     Deep Tendon Reflexes: Reflexes normal.     Comments: Negative Dix-Hallpike bilaterally  Psychiatric:        Mood and Affect: Mood normal.     ED Results / Procedures / Treatments   Labs (all labs ordered are listed, but only abnormal results are displayed) Labs Reviewed  CBC WITH  DIFFERENTIAL/PLATELET - Abnormal; Notable for the following components:      Result Value   Neutro Abs 8.5 (*)    All other components within normal limits  URINALYSIS, ROUTINE W REFLEX MICROSCOPIC - Abnormal; Notable for the following components:   Color, Urine COLORLESS (*)    All other components within normal limits  BASIC METABOLIC PANEL - Abnormal; Notable for the following components:   Glucose, Bld 100 (*)    All other components within normal limits  PREGNANCY, URINE    EKG EKG Interpretation Date/Time:  Saturday August 03 2023 09:47:14 EDT Ventricular Rate:  80 PR Interval:  134 QRS Duration:  94 QT Interval:  348 QTC Calculation: 401 R Axis:   -4  Text Interpretation: Normal sinus rhythm Possible Anterior infarct , age undetermined Abnormal ECG No previous ECGs available Confirmed by Fulton Reek [phone]) on 08/03/2023 9:49:20 AM  Radiology CT Head Wo Contrast  Result Date: 08/03/2023 CLINICAL DATA:  Vertigo EXAM: CT HEAD WITHOUT CONTRAST TECHNIQUE: Contiguous axial images were obtained from the base of the skull through the vertex without intravenous contrast. RADIATION DOSE REDUCTION: This exam was performed according to the departmental dose-optimization program which includes automated exposure control, adjustment of the mA and/or kV according to patient size and/or use of iterative reconstruction technique. COMPARISON:  None Available. FINDINGS: Brain: No hemorrhage. No hydrocephalus. No extra-axial fluid collection. No CT evidence of an acute cortical infarct. No mass effect. No mass lesion. Vascular: No hyperdense vessel or unexpected calcification. Skull: Normal. Negative for fracture or focal lesion. Sinuses/Orbits: No middle ear or mastoid effusion. Paranasal sinuses are clear. Orbits are unremarkable. Other: None. IMPRESSION: No acute intracranial abnormality. Electronically Signed   By: Lorenza Cambridge M.D.   On: 08/03/2023 11:39    Procedures Procedures     Medications Ordered in ED Medications  ondansetron (ZOFRAN-ODT) disintegrating tablet 4 mg (4 mg Oral Given 08/03/23 1119)    ED Course/ Medical Decision Making/ A&P                                  Medical Decision Making Amount and/or Complexity of Data Reviewed Labs: ordered. Radiology: ordered.  Risk Prescription drug management.   Medical Decision Making:   Haddie Aubree Doody is a 37 y.o. female who presented to the ED today with intermittent dizziness for a year.  Vital signs normal for mild tachycardia.  Currently she is somewhat nauseous but not  dizzy.  Unclear what is causing the symptoms.  Consider possible peripheral vertigo although not clearly vertiginous symptoms and not worse with movement with negative Dix-Hallpike.  Consider possible orthostasis and presyncope although not worse with standing.  I have lower concern for central etiology especially normal neurologic exam and currently no dizziness, however out of abundance of caution will evaluate with CT scan to rule intracranial mass or lesion near the cranial nerves.  EKG without acute ischemia or arrhythmia low concern for cardiac cause for this.  She has had an echo in June of this year which was reassuring.   Patient placed on continuous vitals and telemetry monitoring while in ED which was reviewed periodically.  Reviewed and confirmed nursing documentation for past medical history, family history, social history.  Reassessment and Plan:   On reassessment feeling better.  Now asymptomatic.  Nausea medicine held.  Unclear what is causing her symptoms.  CT head is unremarkable, no signs of obvious central cause and low suspicion for central vertigo.  Her lab work here is unremarkable as well.  I recommend she follow close with her PCP, strict turn precautions given.  Given phone number for ENT follow-up as well.    Patient's presentation is most consistent with acute complicated illness / injury requiring  diagnostic workup.           Final Clinical Impression(s) / ED Diagnoses Final diagnoses:  Dizziness    Rx / DC Orders ED Discharge Orders          Ordered    ondansetron (ZOFRAN-ODT) 4 MG disintegrating tablet  Every 8 hours PRN        08/03/23 1240              Laurence Spates, MD 08/03/23 1617

## 2023-08-03 NOTE — Discharge Instructions (Signed)
Your CT scan and lab work today were reassuring.  I am not sure what is causing her symptoms and I recommend you follow-up closely with your primary care doctor.  You can call ENT above to schedule follow-up as well.  You can take the prescribed nausea medicine.  If you develop persistent dizziness that is not going away, severe headache, vision changes, weakness, numbness, fainting you should return to the ED.
